# Patient Record
Sex: Female | Born: 1935 | Race: White | Hispanic: No | Smoking: Current every day smoker
Health system: Southern US, Community
[De-identification: ages and names within clinical notes are randomized; demographics above are authoritative.]

## PROBLEM LIST (undated history)

## (undated) DIAGNOSIS — I129 Hypertensive chronic kidney disease with stage 1 through stage 4 chronic kidney disease, or unspecified chronic kidney disease: Secondary | ICD-10-CM

## (undated) DIAGNOSIS — I10 Essential (primary) hypertension: Secondary | ICD-10-CM

## (undated) DIAGNOSIS — G4700 Insomnia, unspecified: Secondary | ICD-10-CM

## (undated) DIAGNOSIS — E559 Vitamin D deficiency, unspecified: Secondary | ICD-10-CM

## (undated) DIAGNOSIS — M1990 Unspecified osteoarthritis, unspecified site: Secondary | ICD-10-CM

## (undated) DIAGNOSIS — R0602 Shortness of breath: Secondary | ICD-10-CM

## (undated) DIAGNOSIS — E785 Hyperlipidemia, unspecified: Secondary | ICD-10-CM

## (undated) DIAGNOSIS — E46 Unspecified protein-calorie malnutrition: Secondary | ICD-10-CM

## (undated) DIAGNOSIS — F419 Anxiety disorder, unspecified: Secondary | ICD-10-CM

## (undated) DIAGNOSIS — F32A Depression, unspecified: Secondary | ICD-10-CM

## (undated) DIAGNOSIS — R7303 Prediabetes: Secondary | ICD-10-CM

## (undated) DIAGNOSIS — J449 Chronic obstructive pulmonary disease, unspecified: Secondary | ICD-10-CM

## (undated) DIAGNOSIS — C50919 Malignant neoplasm of unspecified site of unspecified female breast: Secondary | ICD-10-CM

## (undated) DIAGNOSIS — F329 Major depressive disorder, single episode, unspecified: Secondary | ICD-10-CM

## (undated) DIAGNOSIS — E119 Type 2 diabetes mellitus without complications: Secondary | ICD-10-CM

## (undated) DIAGNOSIS — K219 Gastro-esophageal reflux disease without esophagitis: Secondary | ICD-10-CM

## (undated) HISTORY — DX: Major depressive disorder, single episode, unspecified: F32.9

## (undated) HISTORY — DX: Anxiety disorder, unspecified: F41.9

## (undated) HISTORY — DX: Unspecified protein-calorie malnutrition: E46

## (undated) HISTORY — DX: Prediabetes: R73.03

## (undated) HISTORY — DX: Hyperlipidemia, unspecified: E78.5

## (undated) HISTORY — DX: Insomnia, unspecified: G47.00

## (undated) HISTORY — DX: Chronic obstructive pulmonary disease, unspecified: J44.9

## (undated) HISTORY — DX: Vitamin D deficiency, unspecified: E55.9

## (undated) HISTORY — PX: APPENDECTOMY: SHX54

## (undated) HISTORY — DX: Hypertensive chronic kidney disease with stage 1 through stage 4 chronic kidney disease, or unspecified chronic kidney disease: I12.9

## (undated) HISTORY — DX: Depression, unspecified: F32.A

---

## 1996-08-10 HISTORY — PX: BREAST SURGERY: SHX581

## 1997-12-05 ENCOUNTER — Other Ambulatory Visit: Admission: RE | Admit: 1997-12-05 | Discharge: 1997-12-05 | Payer: Self-pay | Admitting: Hematology and Oncology

## 1998-03-05 ENCOUNTER — Other Ambulatory Visit: Admission: RE | Admit: 1998-03-05 | Discharge: 1998-03-05 | Payer: Self-pay | Admitting: Hematology and Oncology

## 1998-03-07 ENCOUNTER — Encounter: Admission: RE | Admit: 1998-03-07 | Discharge: 1998-06-05 | Payer: Self-pay | Admitting: Hematology and Oncology

## 1998-12-22 ENCOUNTER — Emergency Department (HOSPITAL_COMMUNITY): Admission: EM | Admit: 1998-12-22 | Discharge: 1998-12-22 | Payer: Self-pay | Admitting: Emergency Medicine

## 1999-07-10 ENCOUNTER — Encounter: Payer: Self-pay | Admitting: Hematology and Oncology

## 1999-07-10 ENCOUNTER — Encounter: Admission: RE | Admit: 1999-07-10 | Discharge: 1999-07-10 | Payer: Self-pay | Admitting: Hematology and Oncology

## 1999-12-22 ENCOUNTER — Encounter: Admission: RE | Admit: 1999-12-22 | Discharge: 1999-12-22 | Payer: Self-pay | Admitting: Hematology and Oncology

## 1999-12-22 ENCOUNTER — Encounter: Payer: Self-pay | Admitting: Hematology and Oncology

## 2000-05-14 ENCOUNTER — Encounter: Payer: Self-pay | Admitting: Hematology and Oncology

## 2000-05-14 ENCOUNTER — Encounter: Admission: RE | Admit: 2000-05-14 | Discharge: 2000-05-14 | Payer: Self-pay | Admitting: Hematology and Oncology

## 2000-12-23 ENCOUNTER — Encounter: Admission: RE | Admit: 2000-12-23 | Discharge: 2000-12-23 | Payer: Self-pay | Admitting: Hematology and Oncology

## 2000-12-23 ENCOUNTER — Encounter: Payer: Self-pay | Admitting: Hematology and Oncology

## 2001-05-23 ENCOUNTER — Ambulatory Visit (HOSPITAL_COMMUNITY): Admission: RE | Admit: 2001-05-23 | Discharge: 2001-05-23 | Payer: Self-pay | Admitting: Hematology and Oncology

## 2001-05-23 ENCOUNTER — Encounter: Payer: Self-pay | Admitting: Hematology and Oncology

## 2001-12-22 ENCOUNTER — Encounter: Admission: RE | Admit: 2001-12-22 | Discharge: 2001-12-22 | Payer: Self-pay | Admitting: Hematology and Oncology

## 2001-12-22 ENCOUNTER — Encounter: Payer: Self-pay | Admitting: Hematology and Oncology

## 2005-12-09 ENCOUNTER — Encounter: Payer: Self-pay | Admitting: *Deleted

## 2007-11-28 ENCOUNTER — Emergency Department (HOSPITAL_COMMUNITY): Admission: EM | Admit: 2007-11-28 | Discharge: 2007-11-28 | Payer: Self-pay | Admitting: Emergency Medicine

## 2007-12-30 ENCOUNTER — Emergency Department (HOSPITAL_COMMUNITY): Admission: EM | Admit: 2007-12-30 | Discharge: 2007-12-30 | Payer: Self-pay | Admitting: Emergency Medicine

## 2008-01-27 ENCOUNTER — Emergency Department (HOSPITAL_COMMUNITY): Admission: EM | Admit: 2008-01-27 | Discharge: 2008-01-27 | Payer: Self-pay | Admitting: Family Medicine

## 2009-02-03 ENCOUNTER — Emergency Department (HOSPITAL_COMMUNITY): Admission: EM | Admit: 2009-02-03 | Discharge: 2009-02-03 | Payer: Self-pay | Admitting: Family Medicine

## 2010-04-11 ENCOUNTER — Emergency Department (HOSPITAL_COMMUNITY): Admission: EM | Admit: 2010-04-11 | Discharge: 2010-04-11 | Payer: Self-pay | Admitting: Family Medicine

## 2010-04-25 ENCOUNTER — Emergency Department (HOSPITAL_COMMUNITY): Admission: EM | Admit: 2010-04-25 | Discharge: 2010-04-25 | Payer: Self-pay | Admitting: Family Medicine

## 2010-08-14 ENCOUNTER — Emergency Department (HOSPITAL_COMMUNITY)
Admission: EM | Admit: 2010-08-14 | Discharge: 2010-08-14 | Payer: Self-pay | Source: Home / Self Care | Admitting: Emergency Medicine

## 2010-10-23 LAB — POCT URINALYSIS DIPSTICK
Glucose, UA: NEGATIVE mg/dL
Nitrite: NEGATIVE
Specific Gravity, Urine: 1.02 (ref 1.005–1.030)
Urobilinogen, UA: 0.2 mg/dL (ref 0.0–1.0)

## 2011-01-29 ENCOUNTER — Inpatient Hospital Stay (INDEPENDENT_AMBULATORY_CARE_PROVIDER_SITE_OTHER)
Admission: RE | Admit: 2011-01-29 | Discharge: 2011-01-29 | Disposition: A | Discharge status: Home / Self Care | Payer: Medicare Other | Source: Ambulatory Visit | Attending: Emergency Medicine | Admitting: Emergency Medicine

## 2011-01-29 ENCOUNTER — Ambulatory Visit (INDEPENDENT_AMBULATORY_CARE_PROVIDER_SITE_OTHER): Payer: Medicare Other

## 2011-01-29 DIAGNOSIS — R05 Cough: Secondary | ICD-10-CM

## 2011-01-29 DIAGNOSIS — J449 Chronic obstructive pulmonary disease, unspecified: Secondary | ICD-10-CM

## 2011-01-29 DIAGNOSIS — J3489 Other specified disorders of nose and nasal sinuses: Secondary | ICD-10-CM

## 2011-05-07 LAB — CBC
HCT: 35.9 — ABNORMAL LOW
Hemoglobin: 12.5
MCHC: 34.8
MCV: 94.1
RDW: 15.5

## 2011-05-07 LAB — POCT I-STAT, CHEM 8
BUN: 39 — ABNORMAL HIGH
Calcium, Ion: 1.03 — ABNORMAL LOW
Glucose, Bld: 162 — ABNORMAL HIGH
HCT: 37
TCO2: 27

## 2011-05-07 LAB — URINALYSIS, ROUTINE W REFLEX MICROSCOPIC
Glucose, UA: NEGATIVE
Nitrite: NEGATIVE
Protein, ur: NEGATIVE
Urobilinogen, UA: 0.2

## 2011-05-07 LAB — URINE MICROSCOPIC-ADD ON

## 2011-05-07 LAB — DIFFERENTIAL
Basophils Absolute: 0
Basophils Relative: 0
Eosinophils Relative: 0
Monocytes Absolute: 0.9
Monocytes Relative: 10

## 2011-12-29 ENCOUNTER — Ambulatory Visit (HOSPITAL_COMMUNITY)
Admission: RE | Admit: 2011-12-29 | Discharge: 2011-12-29 | Disposition: A | Discharge status: Home / Self Care | Payer: Medicare Other | Source: Ambulatory Visit | Attending: Emergency Medicine | Admitting: Emergency Medicine

## 2011-12-29 ENCOUNTER — Other Ambulatory Visit (HOSPITAL_COMMUNITY): Payer: Self-pay | Admitting: Emergency Medicine

## 2011-12-29 DIAGNOSIS — R05 Cough: Secondary | ICD-10-CM

## 2011-12-29 DIAGNOSIS — F17200 Nicotine dependence, unspecified, uncomplicated: Secondary | ICD-10-CM | POA: Insufficient documentation

## 2011-12-29 DIAGNOSIS — R059 Cough, unspecified: Secondary | ICD-10-CM | POA: Insufficient documentation

## 2012-03-21 DIAGNOSIS — I10 Essential (primary) hypertension: Secondary | ICD-10-CM

## 2013-01-19 ENCOUNTER — Emergency Department (INDEPENDENT_AMBULATORY_CARE_PROVIDER_SITE_OTHER)
Admission: EM | Admit: 2013-01-19 | Discharge: 2013-01-19 | Disposition: A | Discharge status: Home / Self Care | Payer: Medicare Other | Source: Home / Self Care

## 2013-01-19 ENCOUNTER — Encounter (HOSPITAL_COMMUNITY): Payer: Self-pay | Admitting: *Deleted

## 2013-01-19 DIAGNOSIS — M659 Synovitis and tenosynovitis, unspecified: Secondary | ICD-10-CM

## 2013-01-19 DIAGNOSIS — M65849 Other synovitis and tenosynovitis, unspecified hand: Secondary | ICD-10-CM

## 2013-01-19 MED ORDER — DICLOFENAC EPOLAMINE 1.3 % TD PTCH: MEDICATED_PATCH | TRANSDERMAL | Status: DC

## 2013-01-19 NOTE — ED Provider Notes (Signed)
History     CSN: [id number]  Arrival date & time 01/19/13  1004   First MD Initiated Contact with Patient 01/19/13 1033      Chief Complaint  Patient presents with  . Wrist Pain    (Consider location/radiation/quality/duration/timing/severity/associated sxs/prior treatment) HPI Comments: 79 year old female presents complaining of left ulnar wrist pain that goes through her hand and into her fifth digit. She has been working in her garden a lot and the longer she works, it makes her wrist start to swell and hurt. This has been going on for a few days. She denies any pain elsewhere or swelling elsewhere, but does admit to a history of osteoarthritis in her hands. She denies any history of MI or stroke. She denies any nausea, vomiting, diaphoresis, or dizziness.  Patient is a 79 y.o. female presenting with wrist pain.  Wrist Pain Pertinent negatives include no chest pain, no abdominal pain and no shortness of breath.    History reviewed. No pertinent past medical history.  History reviewed. No pertinent past surgical history.  No family history on file.  History  Substance Use Topics  . Smoking status: Current Every Day Smoker  . Smokeless tobacco: Not on file  . Alcohol Use: No    OB History   Grav Para Term Preterm Abortions TAB SAB Ect Mult Living                  Review of Systems  Constitutional: Negative for fever and chills.  Eyes: Negative for visual disturbance.  Respiratory: Negative for cough and shortness of breath.   Cardiovascular: Negative for chest pain, palpitations and leg swelling.  Gastrointestinal: Negative for nausea, vomiting and abdominal pain.  Endocrine: Negative for polydipsia and polyuria.  Genitourinary: Negative for dysuria, urgency and frequency.  Musculoskeletal: Positive for arthralgias (see HPI). Negative for myalgias.  Skin: Negative for rash.  Neurological: Negative for dizziness, weakness and light-headedness.    Allergies   Review of patient's allergies indicates no known allergies.  Home Medications   Current Outpatient Rx  Name  Route  Sig  Dispense  Refill  . diclofenac (FLECTOR) 1.3 % PTCH      Apply patch to pinky side of left wrist Q12 hrs   30 patch   0     BP 124/70  Pulse 78  Temp(Src) 97.6 F (36.4 C) (Oral)  SpO2 96%  Physical Exam  Nursing note and vitals reviewed. Constitutional: She is oriented to person, place, and time. Vital signs are normal. She appears well-developed and well-nourished. No distress.  HENT:  Head: Atraumatic.  Eyes: EOM are normal. Pupils are equal, round, and reactive to light.  Cardiovascular: Normal rate, regular rhythm and normal heart sounds.  Exam reveals no gallop and no friction rub.   No murmur heard. Pulmonary/Chest: Effort normal and breath sounds normal. No respiratory distress. She has no wheezes. She has no rales.  Abdominal: Soft. There is no tenderness.  Musculoskeletal:       Left wrist: She exhibits tenderness (tenderness in the distribution of the extensor carpui ulnaris) and swelling (mild). She exhibits normal range of motion, no bony tenderness, no effusion, no crepitus and no deformity.  Neurological: She is alert and oriented to person, place, and time. She has normal strength.  Skin: Skin is warm and dry. She is not diaphoretic.  Psychiatric: She has a normal mood and affect. Her behavior is normal. Judgment normal.    ED Course  Procedures (including critical care time)  Labs Reviewed - No data to display No results found.   1. Tenosynovitis of wrist       MDM  There are no other symptoms and non-musculoskeletal cause of this patient's wrist pain. Therefore, we'll treat this tenosynovitis with a flector patch twice a day and a wrist splint. She will followup in one week if she is not improved significantly   Meds ordered this encounter  Medications  . diclofenac (FLECTOR) 1.3 % PTCH    Sig: Apply patch to pinky side of  left wrist Q12 hrs    Dispense:  30 patch    Refill:  0           Graylon Good, PA-C 01/19/13 1108

## 2013-01-19 NOTE — ED Notes (Signed)
Pt  Reports   l   Wrist  Pain     For  About  1    Week          denys  Any  specefic  Injury          No  Obvious deformity  Noted

## 2013-05-10 ENCOUNTER — Emergency Department (INDEPENDENT_AMBULATORY_CARE_PROVIDER_SITE_OTHER): Payer: Medicare Other

## 2013-05-10 ENCOUNTER — Encounter (HOSPITAL_COMMUNITY): Payer: Self-pay | Admitting: Emergency Medicine

## 2013-05-10 ENCOUNTER — Emergency Department (INDEPENDENT_AMBULATORY_CARE_PROVIDER_SITE_OTHER)
Admission: EM | Admit: 2013-05-10 | Discharge: 2013-05-10 | Disposition: A | Discharge status: Home / Self Care | Payer: Medicare Other | Source: Home / Self Care

## 2013-05-10 DIAGNOSIS — S300XXA Contusion of lower back and pelvis, initial encounter: Secondary | ICD-10-CM

## 2013-05-10 HISTORY — DX: Essential (primary) hypertension: I10

## 2013-05-10 HISTORY — DX: Malignant neoplasm of unspecified site of unspecified female breast: C50.919

## 2013-05-10 MED ORDER — TRAMADOL-ACETAMINOPHEN 37.5-325 MG PO TABS: ORAL_TABLET | ORAL | Status: DC

## 2013-05-10 NOTE — ED Provider Notes (Signed)
Medical screening examination/treatment/procedure(s) were performed by non-physician practitioner and as supervising physician I was immediately available for consultation/collaboration.  Soua Lenk, M.D.  Donyale Falcon C Karyme Mcconathy, MD 05/10/13 1521 

## 2013-05-10 NOTE — ED Notes (Signed)
Pt reports she fell yest around 1230 outside her home and onto concrete Reports she fell backwards and since then has been having pain on left buttocks Pt ambulated well to exam room w/NAD and sister in law in room w/pt.  Denies: head inj/LOC She is alert w/no signs of acute distress.

## 2013-05-10 NOTE — ED Provider Notes (Signed)
CSN: [id number]     Arrival date & time 05/10/13  1126 History   First MD Initiated Contact with Patient 05/10/13 1301     Chief Complaint  Patient presents with  . Fall   (Consider location/radiation/quality/duration/timing/severity/associated sxs/prior Treatment) HPI Comments:  Well-preserved 79 -year-old female was standing yesterday and fell backwards onto her buttock. She is complaining of pain to the left buttock and posterior hip. She is able to stand and bear full weight and   Ambulate short steps but with a limp. She points to a bruise over the gluteus maximus. Denies injury to the head, neck, chest, back or other extremities.    Past Medical History  Diagnosis Date  . Hypertension   . Breast cancer    History reviewed. No pertinent past surgical history. No family history on file. History  Substance Use Topics  . Smoking status: Current Every Day Smoker    Types: Cigarettes  . Smokeless tobacco: Not on file  . Alcohol Use: No   OB History   Grav Para Term Preterm Abortions TAB SAB Ect Mult Living                 Review of Systems  Constitutional: Negative for fever, chills and activity change.  HENT: Negative.   Respiratory: Negative.   Cardiovascular: Negative.   Musculoskeletal:       As per HPI  Skin: Negative for color change, pallor and rash.       A half centimeter diameter ecchymosis over the mid left gluteus maximus.  Neurological: Negative.     Allergies  Review of patient's allergies indicates no known allergies.  Home Medications   Current Outpatient Rx  Name  Route  Sig  Dispense  Refill  . diclofenac (FLECTOR) 1.3 % PTCH      Apply patch to pinky side of left wrist Q12 hrs   30 patch   0    BP 160/88  Pulse 84  Temp(Src) 97.7 F (36.5 C) (Oral)  Resp 16  SpO2 99% Physical Exam  Nursing note and vitals reviewed. Constitutional: She is oriented to person, place, and time. She appears well-developed and well-nourished. No distress.   HENT:  Head: Normocephalic and atraumatic.  Eyes: EOM are normal. Pupils are equal, round, and reactive to light.  Neck: Normal range of motion. Neck supple.  Cardiovascular: Normal rate.   Pulmonary/Chest: Effort normal. No respiratory distress.  Musculoskeletal: She exhibits no edema.  Tenderness to the posterior hip and buttock. No tenderness to the ilium bilateral hip.  Neurological: She is alert and oriented to person, place, and time. No cranial nerve deficit.  Skin: Skin is warm and dry.  And ecchymosis to the left buttock as above.    ED Course  Procedures (including critical care time) Labs Review Labs Reviewed - No data to display Imaging Review Dg Hip Complete Left  05/10/2013   CLINICAL DATA:  Larey Seat yesterday onto concrete, left hip pain, bruising left buttock, initial encounter, history breast cancer, hypertension  EXAM: LEFT HIP - COMPLETE 2+ VIEW  COMPARISON:  None  FINDINGS: Osseous demineralization.  Hip and SI joint spaces symmetric and preserved.  Mild degenerative disc and facet disease changes at visualized lower lumbar spine with levoconvex scoliosis.  No acute fracture, dislocation, or bone destruction.  IMPRESSION: Osseous demineralization.  No acute left hip abnormalities.  Degenerative disc and facet disease changes lower lumbar spine.   Electronically Signed   By: Ulyses Southward M.D.   On:  05/10/2013 14:13    MDM   1. Contusion, buttock, initial encounter      Apply ice to the area of soreness of the nose as needed. May take Tylenol for pain. Any new symptoms problems or worsening may return or followup with your PCP. Reassurance. Patient requesting pain medicines. Ultracet, one half tablet every 6 hours when necessary pain #15  Hayden Rasmussen, NP 05/10/13 1426 and in the  Hayden Rasmussen, NP 05/10/13 1434

## 2013-06-16 ENCOUNTER — Encounter: Payer: Self-pay | Admitting: Internal Medicine

## 2013-06-16 DIAGNOSIS — F419 Anxiety disorder, unspecified: Secondary | ICD-10-CM | POA: Insufficient documentation

## 2013-06-16 DIAGNOSIS — E559 Vitamin D deficiency, unspecified: Secondary | ICD-10-CM

## 2013-06-16 DIAGNOSIS — G4700 Insomnia, unspecified: Secondary | ICD-10-CM | POA: Insufficient documentation

## 2013-06-16 DIAGNOSIS — E785 Hyperlipidemia, unspecified: Secondary | ICD-10-CM | POA: Insufficient documentation

## 2013-06-16 DIAGNOSIS — F329 Major depressive disorder, single episode, unspecified: Secondary | ICD-10-CM | POA: Insufficient documentation

## 2013-06-16 DIAGNOSIS — I129 Hypertensive chronic kidney disease with stage 1 through stage 4 chronic kidney disease, or unspecified chronic kidney disease: Secondary | ICD-10-CM

## 2013-06-16 DIAGNOSIS — R7303 Prediabetes: Secondary | ICD-10-CM

## 2013-06-16 DIAGNOSIS — I10 Essential (primary) hypertension: Secondary | ICD-10-CM | POA: Insufficient documentation

## 2013-06-16 DIAGNOSIS — J449 Chronic obstructive pulmonary disease, unspecified: Secondary | ICD-10-CM

## 2013-06-19 ENCOUNTER — Encounter: Payer: Self-pay | Admitting: Physician Assistant

## 2013-06-19 ENCOUNTER — Ambulatory Visit: Payer: Medicare Other | Admitting: Physician Assistant

## 2013-06-19 VITALS — BP 140/82 | HR 76 | Temp 98.6°F | Resp 16 | Ht 63.0 in | Wt 87.0 lb

## 2013-06-19 DIAGNOSIS — E785 Hyperlipidemia, unspecified: Secondary | ICD-10-CM

## 2013-06-19 DIAGNOSIS — I10 Essential (primary) hypertension: Secondary | ICD-10-CM

## 2013-06-19 DIAGNOSIS — E46 Unspecified protein-calorie malnutrition: Secondary | ICD-10-CM

## 2013-06-19 DIAGNOSIS — E559 Vitamin D deficiency, unspecified: Secondary | ICD-10-CM

## 2013-06-19 DIAGNOSIS — R7303 Prediabetes: Secondary | ICD-10-CM

## 2013-06-19 HISTORY — DX: Unspecified protein-calorie malnutrition: E46

## 2013-06-19 LAB — BASIC METABOLIC PANEL WITH GFR
BUN: 17 mg/dL (ref 6–23)
Calcium: 9.9 mg/dL (ref 8.4–10.5)
Chloride: 97 mEq/L (ref 96–112)
Creat: 0.83 mg/dL (ref 0.50–1.10)
GFR, Est African American: 77 mL/min
GFR, Est Non African American: 67 mL/min

## 2013-06-19 LAB — CBC WITH DIFFERENTIAL/PLATELET
Basophils Absolute: 0.1 10*3/uL (ref 0.0–0.1)
Basophils Relative: 1 % (ref 0–1)
Eosinophils Relative: 2 % (ref 0–5)
Lymphocytes Relative: 33 % (ref 12–46)
MCHC: 32.8 g/dL (ref 30.0–36.0)
MCV: 86.7 fL (ref 78.0–100.0)
Platelets: 289 10*3/uL (ref 150–400)
RDW: 15.3 % (ref 11.5–15.5)
WBC: 6.4 10*3/uL (ref 4.0–10.5)

## 2013-06-19 LAB — LIPID PANEL
Cholesterol: 188 mg/dL (ref 0–200)
HDL: 93 mg/dL (ref >39)
Total CHOL/HDL Ratio: 2 Ratio
Triglycerides: 77 mg/dL (ref <150)

## 2013-06-19 LAB — HEPATIC FUNCTION PANEL
Albumin: 4.5 g/dL (ref 3.5–5.2)
Alkaline Phosphatase: 73 U/L (ref 39–117)
Total Bilirubin: 0.6 mg/dL (ref 0.3–1.2)
Total Protein: 7.2 g/dL (ref 6.0–8.3)

## 2013-06-19 MED ORDER — LORAZEPAM 2 MG PO TABS: 2.0000 mg | ORAL_TABLET | Freq: Four times a day (QID) | ORAL | Status: DC | PRN

## 2013-06-19 MED ORDER — TRAMADOL-ACETAMINOPHEN 37.5-325 MG PO TABS: ORAL_TABLET | ORAL | Status: DC

## 2013-06-19 NOTE — Patient Instructions (Addendum)
The Lorazepam you can can take 1/2 at night or take 1/4 of the pill during the day for Anxiety.  You can take the tramadol as needed for pain.   Carpal Tunnel Syndrome The carpal tunnel is an area under the skin of the palm of your hand. Nerves, blood vessels, and strong tissues (tendons) pass through the tunnel. The tunnel can become puffy (swollen). If this happens, a nerve can be pinched in the wrist. This causes carpal tunnel syndrome.  HOME CARE  Take all medicine as told by your doctor.  If you were given a splint, wear it as told. Wear it at night or at times when your doctor told you to.  Rest your wrist from the activity that causes your pain.  Put ice on your wrist after long periods of wrist activity.  Put ice in a plastic bag.  Place a towel between your skin and the bag.  Leave the ice on for 15-20 minutes, 03-04 times a day.  Keep all doctor visits as told. GET HELP RIGHT AWAY IF:  You have new problems you cannot explain.  Your problems get worse and medicine does not help. MAKE SURE YOU:   Understand these instructions.  Will watch your condition.  Will get help right away if you are not doing well or get worse. Document Released: 07/16/2011 Document Revised: 10/19/2011 Document Reviewed: 07/16/2011 [facility] Patient Information 2014 [place], [place]. Generalized Anxiety Disorder Generalized anxiety disorder (GAD) is a mental disorder. It interferes with life functions, including relationships, work, and school. GAD is different from normal anxiety, which everyone experiences at some point in their lives in response to specific life events and activities. Normal anxiety actually helps [place] prepare for and get through these life events and activities. Normal anxiety goes away after the event or activity is over.  GAD causes anxiety that is not necessarily related to specific events or activities. It also causes excess anxiety in proportion to specific events or  activities. The anxiety associated with GAD is also difficult to control. GAD can vary from mild to severe. People with severe GAD can have intense waves of anxiety with physical symptoms (panic attacks).  SYMPTOMS The anxiety and worry associated with GAD are difficult to control. This anxiety and worry are related to many life events and activities and also occur more days than not for 6 months or longer. People with GAD also have three or more of the following symptoms (one or more in children):  Restlessness.   Fatigue.  Difficulty concentrating.   Irritability.  Muscle tension.  Difficulty sleeping or unsatisfying sleep. DIAGNOSIS GAD is diagnosed through an assessment by your caregiver. Your caregiver will ask you questions aboutyour mood,physical symptoms, and events in your life. Your caregiver may ask you about your medical history and use of alcohol or drugs, including prescription medications. Your caregiver may also do a physical exam and blood tests. Certain medical conditions and the use of certain substances can cause symptoms similar to those associated with GAD. Your caregiver may refer you to a mental health specialist for further evaluation. TREATMENT The following therapies are usually used to treat GAD:   Medication Antidepressant medication usually is prescribed for long-term daily control. Antianxiety medications may be added in severe cases, especially when panic attacks occur.   Talk therapy (psychotherapy) Certain types of talk therapy can be helpful in treating GAD by providing support, education, and guidance. A form of talk therapy called cognitive behavioral therapy can teach you  healthy ways to think about and react to daily life events and activities.  Stress managementtechniques These include yoga, meditation, and exercise and can be very helpful when they are practiced regularly. A mental health specialist can help determine which treatment is best for  you. Some people see improvement with one therapy. However, other people require a combination of therapies. Document Released: 11/21/2012 Document Reviewed: 11/21/2012 [facility] Patient Information 2014 [place], [place].

## 2013-06-19 NOTE — Progress Notes (Signed)
HPI Patient presents for 6 month follow up with hypertension, hyperlipidemia, prediabetes and vitamin D.  Patient's blood pressure has been controlled at home. Patient denies chest pain, shortness of breath, dizziness.  Patient's cholesterol is diet controlled and is on pravastatin  and denies myalgias diffuse. Does complain of right 1st MCP pain and is requesting more Tramadol.  she has  been working on diet and exercise for prediabetes, denies changes in vision, polys, and paresthesias.   Patient is on Vitamin D supplement.  She was given lorazepam 2 mg for sleeping at night and states that she is doing better with that but that her nerves during the day are bad.  Current Medications:    Medication List       This list is accurate as of: 06/19/13  9:14 AM.  Always use your most recent med list.               aspirin 325 MG EC tablet  Take 325 mg by mouth daily.     bisoprolol-hydrochlorothiazide 10-6.25 MG per tablet  Commonly known as:  ZIAC  Take 1 tablet by mouth daily.     citalopram 20 MG tablet  Commonly known as:  CELEXA  Take 20 mg by mouth daily.     diclofenac 1.3 % Ptch  Commonly known as:  FLECTOR  Apply patch to pinky side of left wrist Q12 hrs     hydrochlorothiazide 25 MG tablet  Commonly known as:  HYDRODIURIL  Take 25 mg by mouth daily.     LORazepam 2 MG tablet  Commonly known as:  ATIVAN  Take 1 tablet (2 mg total) by mouth every 6 (six) hours as needed for anxiety.     LOTENSIN 40 MG tablet  Generic drug:  benazepril  Take 40 mg by mouth daily.     minoxidil 2.5 MG tablet  Commonly known as:  LONITEN  Take 2.5 mg by mouth daily.     pravastatin 40 MG tablet  Commonly known as:  PRAVACHOL  Take 40 mg by mouth daily.     traMADol-acetaminophen 37.5-325 MG per tablet  Commonly known as:  ULTRACET  1/2 tablet every 6 hours as needed for pain       Allergies: No Known Allergies  ROS Constitutional: + insomnia Denies fever, chills, weight  loss/gain, headaches, fatigue, night sweats, and change in appetite. Eyes: Denies redness, blurred vision, diplopia, discharge, itchy, watery eyes.  ENT: Denies discharge, congestion, post nasal drip, sore throat, earache, dental pain, Tinnitus, Vertigo, Sinus pain, snoring.  Cardio: Denies chest pain, palpitations, irregular heartbeat,  dyspnea, diaphoresis, orthopnea, PND, claudication, edema Respiratory: denies cough, dyspnea,pleurisy, hoarseness, wheezing.  Gastrointestinal: Denies dysphagia, heartburn,  water brash, pain, cramps, nausea, vomiting, bloating, diarrhea, constipation, hematemesis, melena, hematochezia,  hemorrhoids Genitourinary: Denies dysuria, frequency, urgency, nocturia, hesitancy, discharge, hematuria, flank pain Musculoskeletal:+ right thumb pain X months Denies arthralgia, myalgia, stiffness, Jt. Swelling, pain, limp, and strain/sprain. Skin: Denies puritis, rash, hives, warts, acne, eczema, changing in skin lesion Neuro: Weakness, tremor, incoordination, spasms, paresthesia, pain Psychiatric: Denies confusion, memory loss, sensory loss Endocrine: Denies change in weight, skin, hair change, nocturia, and paresthesia, Diabetic Polys, visual blurring, hyper /hypo glycemic episodes.  Heme/Lymph: Excessive bleeding, bruising, enlarged lymph nodes Medical History:  Past Medical History  Diagnosis Date  . Hypertension   . Breast cancer   . Hyperlipidemia   . COPD (chronic obstructive pulmonary disease)   . Anxiety   . Depression   . Prediabetes   .  Vitamin D deficiency   . Insomnia   . Hypertensive CKD (chronic kidney disease)   . Unspecified protein-calorie malnutrition 06/19/2013   Family history- Review and unchanged Social history- Review and unchanged Physical Exam: Filed Vitals:   06/19/13 0841  BP: 140/82  Pulse: 76  Temp: 98.6 F (37 C)  Resp: 16  Estimated body mass index is 15.42 kg/(m^2) as calculated from the following:   Height as of this  encounter: 5\' 3"  (1.6 m).   Weight as of this encounter: 87 lb (39.463 kg).  General Appearance: Cachetic, barrel-chested female , in no apparent distress. Eyes: PERRLA, EOMs, conjunctiva no swelling or erythema, normal fundi and vessels. Sinuses: No Frontal/maxillary tenderness ENT/Mouth: Ext aud canals clear, with TMs without erythema, bulging.No erythema, swelling, or exudate on post pharynx.  Tonsils not swollen or erythematous. Hearing normal.  Neck: Supple, thyroid normal.  Respiratory: Respiratory effort normal, decreased BS bilaterally without rales, rhonci, wheezing or stridor.  Cardio: Heart sounds normal, regular rate and rhythm without murmurs, rubs or gallops. Without edema.  Abdomen: Flat, soft, with bowel sounds. Nontender, no guarding, rebound, hernias, masses, or organomegaly.  Lymphatics: Non tender without lymphadenopathy.  Musculoskeletal: Right MCP of thumb with atophy at thenar and pain. Full ROM all peripheral extremities, joint stability, 4/5 strength, and normal gait. Skin: Warm, dry without rashes, lesions, ecchymosis.  Neuro: Cranial nerves intact, reflexes equal bilaterally. Normal muscle tone, no cerebellar symptoms. Sensation intact.  Pysch: Awake and oriented X 3, normal affect, Insight and Judgment appropriate.   Assessement and Plan: Hypertension: Continue medication, monitor blood pressure at home. Continue DASH diet. Cholesterol: Continue diet and exercise. Check cholesterol.  Pre-diabetes-Continue diet and exercise. Check A1C Vitamin D Def- check level and continue medications.  Insomnia- cant continue Lorzepam 2mg - can take 1/2 at night and 1/4 during the day.  Right thumb pain- possible untreated CTS- wear brace at night for 3 weeks and refill tramadol for pain.  Malnutrition- BMI 15, add ensure.  Patient and I discussed about her living at home alone and how her children are concerned about her living from home. However she has not fallen in the past  year, cognitively she is going very well, we discussed her medications and the risk of falling and how to use those. She is able to do ADL's and feels confident at home. We will continue to monitor her current situation.   Quentin Mulling 9:14 AM

## 2013-06-20 ENCOUNTER — Telehealth: Payer: Self-pay

## 2013-06-20 LAB — INSULIN, FASTING: Insulin fasting, serum: 6 u[IU]/mL (ref 3–28)

## 2013-06-20 LAB — VITAMIN D 25 HYDROXY (VIT D DEFICIENCY, FRACTURES): Vit D, 25-Hydroxy: 42 ng/mL (ref 30–89)

## 2013-06-20 NOTE — Telephone Encounter (Signed)
Message copied by Linwood Dibbles on Tue Jun 20, 2013 11:21 AM ------      Message from: Quentin Mulling R      Created: Tue Jun 20, 2013  7:37 AM       Your BMP, CBC, chol, LFTs, TSH, insulin  are all normal. Please add on 2000 IU to your total Vitamin D. Your A1C is very very close to the Diabetes range of 6.5 or higher. You AIC is in prediabetic range which is between 5.7 and 6.4. This is a warning sign for diabetes. Your A1C is a measure of your sugar over the past 3 months and is not affected by what you have eaten over the past few days. Diabetes increases your chances of stroke and heart attack over 300 % and is the leading cause of blindness and kidney failure in the [place]. Please make sure you decrease bad carbs like white bread, white rice, potatoes, corn, soft drinks, pasta, cereals, refined sugars, sweet tea, dried fruits, and fruit juice. Good carbs are okay to eat in moderation like sweet potatoes, brown rice, whole grain pasta/bread, most fruit (expect dried fruit) and you can eat as many veggies as you want.        ------

## 2013-07-21 ENCOUNTER — Other Ambulatory Visit: Payer: Self-pay | Admitting: Internal Medicine

## 2013-07-21 DIAGNOSIS — R110 Nausea: Secondary | ICD-10-CM

## 2013-07-21 MED ORDER — PROCHLORPERAZINE MALEATE 5 MG PO TABS: ORAL_TABLET | ORAL | Status: DC

## 2013-07-21 MED ORDER — ONDANSETRON HCL 8 MG PO TABS: ORAL_TABLET | ORAL | Status: DC

## 2013-07-28 ENCOUNTER — Other Ambulatory Visit: Payer: Self-pay | Admitting: Internal Medicine

## 2013-07-30 ENCOUNTER — Encounter (HOSPITAL_COMMUNITY): Payer: Self-pay | Admitting: Emergency Medicine

## 2013-07-30 ENCOUNTER — Emergency Department (INDEPENDENT_AMBULATORY_CARE_PROVIDER_SITE_OTHER): Payer: Medicare Other

## 2013-07-30 ENCOUNTER — Emergency Department (INDEPENDENT_AMBULATORY_CARE_PROVIDER_SITE_OTHER)
Admission: EM | Admit: 2013-07-30 | Discharge: 2013-07-30 | Disposition: A | Discharge status: Home / Self Care | Payer: Medicare Other | Source: Home / Self Care | Attending: Family Medicine | Admitting: Family Medicine

## 2013-07-30 DIAGNOSIS — J438 Other emphysema: Secondary | ICD-10-CM

## 2013-07-30 DIAGNOSIS — J439 Emphysema, unspecified: Secondary | ICD-10-CM

## 2013-07-30 DIAGNOSIS — J441 Chronic obstructive pulmonary disease with (acute) exacerbation: Secondary | ICD-10-CM

## 2013-07-30 MED ORDER — ALBUTEROL SULFATE HFA 108 (90 BASE) MCG/ACT IN AERS: 2.0000 | INHALATION_SPRAY | RESPIRATORY_TRACT | Status: DC | PRN

## 2013-07-30 MED ORDER — AEROCHAMBER PLUS FLO-VU LARGE MISC: 1.0000 | Freq: Once | Status: AC

## 2013-07-30 MED ORDER — PREDNISONE 50 MG PO TABS: ORAL_TABLET | ORAL | Status: DC

## 2013-07-30 MED ORDER — LEVOFLOXACIN 500 MG PO TABS: 500.0000 mg | ORAL_TABLET | Freq: Every day | ORAL | Status: DC

## 2013-07-30 NOTE — ED Provider Notes (Signed)
CSN: [id number]     Arrival date & time 07/30/13  1243 History   First MD Initiated Contact with Patient 07/30/13 1418     Chief Complaint  Patient presents with  . Shortness of Breath   (Consider location/radiation/quality/duration/timing/severity/associated sxs/prior Treatment) HPI Comments: 79 year old female presents for evaluation of shortness of breath for one week. This is described as mild, feeling like it is difficult to take a full breath. She also has a productive cough for one week. Other than that, she has no concerns. Chills, NVD, chest pain, pleuritic pain. She has a history of COPD on the chart but she does not have any treatment for this. No weight loss or night sweats, no hemoptysis.  Her family member expresses concern for frequent nausea and early satiety. She has a history of breast cancer but apparently has not been checked for this approximately 10 years. She is a current smoker, currently smoking about one half pack daily, smoker for the past 50 years. No leg swelling. No recent air travel. No history of DVT or PE.  Patient is a 79 y.o. female presenting with shortness of breath.  Shortness of Breath Associated symptoms: cough   Associated symptoms: no abdominal pain, no chest pain, no fever, no rash and no vomiting     Past Medical History  Diagnosis Date  . Hypertension   . Breast cancer   . Hyperlipidemia   . COPD (chronic obstructive pulmonary disease)   . Anxiety   . Depression   . Prediabetes   . Vitamin D deficiency   . Insomnia   . Hypertensive CKD (chronic kidney disease)   . Unspecified protein-calorie malnutrition 06/19/2013   Past Surgical History  Procedure Laterality Date  . Breast surgery  1998   Family History  Problem Relation Age of Onset  . Heart disease Mother   . Heart disease Father   . Heart disease Daughter    History  Substance Use Topics  . Smoking status: Current Every Day Smoker -- 1.00 packs/day for 65 years    Types:  Cigarettes  . Smokeless tobacco: Never Used  . Alcohol Use: No   OB History   Grav Para Term Preterm Abortions TAB SAB Ect Mult Living                 Review of Systems  Constitutional: Negative for fever and chills.  Eyes: Negative for visual disturbance.  Respiratory: Positive for cough and shortness of breath.   Cardiovascular: Negative for chest pain, palpitations and leg swelling.  Gastrointestinal: Negative for nausea, vomiting and abdominal pain.  Endocrine: Negative for polydipsia and polyuria.  Genitourinary: Negative for dysuria, urgency and frequency.  Musculoskeletal: Negative for arthralgias and myalgias.  Skin: Negative for rash.  Neurological: Negative for dizziness, weakness and light-headedness.    Allergies  Review of patient's allergies indicates no known allergies.  Home Medications   Current Outpatient Rx  Name  Route  Sig  Dispense  Refill  . aspirin 325 MG EC tablet   Oral   Take 325 mg by mouth daily.         . benazepril (LOTENSIN) 40 MG tablet   Oral   Take 40 mg by mouth daily.         . bisoprolol-hydrochlorothiazide (ZIAC) 10-6.25 MG per tablet   Oral   Take 1 tablet by mouth daily.         . citalopram (CELEXA) 20 MG tablet   Oral   Take 20  mg by mouth daily.         . hydrochlorothiazide (HYDRODIURIL) 25 MG tablet   Oral   Take 25 mg by mouth daily.         Marland Kitchen LORazepam (ATIVAN) 2 MG tablet   Oral   Take 1 tablet (2 mg total) by mouth every 6 (six) hours as needed for anxiety.   90 tablet   0   . pravastatin (PRAVACHOL) 40 MG tablet   Oral   Take 40 mg by mouth daily.         Marland Kitchen albuterol (PROVENTIL HFA;VENTOLIN HFA) 108 (90 BASE) MCG/ACT inhaler   Inhalation   Inhale 2 puffs into the lungs every 4 (four) hours as needed for wheezing.   1 Inhaler   0   . diclofenac (FLECTOR) 1.3 % PTCH      Apply patch to pinky side of left wrist Q12 hrs   30 patch   0   . levofloxacin (LEVAQUIN) 500 MG tablet   Oral    Take 1 tablet (500 mg total) by mouth daily.   7 tablet   0   . minoxidil (LONITEN) 2.5 MG tablet   Oral   Take 2.5 mg by mouth daily.         . ondansetron (ZOFRAN) 8 MG tablet      1 tablet 3 x daily for nausea   30 tablet   0   . predniSONE (DELTASONE) 50 MG tablet      1 tab PO QD   5 tablet   0   . prochlorperazine (COMPAZINE) 5 MG tablet      1 to 2 tablets 3 - 4 x daily to prevent nausea   30 tablet   1   . Spacer/Aero-Holding Chambers (AEROCHAMBER PLUS FLO-VU LARGE) MISC   Other   1 each by Other route once.   1 each   0   . traMADol-acetaminophen (ULTRACET) 37.5-325 MG per tablet      1/2 tablet every 6 hours as needed for pain   90 tablet   0    BP 184/85  Pulse 81  Temp(Src) 98.4 F (36.9 C) (Oral)  Resp 16  SpO2 93% Physical Exam  Nursing note and vitals reviewed. Constitutional: She is oriented to person, place, and time. Vital signs are normal. She appears well-developed and well-nourished. No distress.  Thin habitus  HENT:  Head: Normocephalic and atraumatic.  Cardiovascular: Normal rate, regular rhythm, normal heart sounds and normal pulses.  Exam reveals no gallop and no friction rub.   No murmur heard. No peripheral edema  Pulmonary/Chest: Effort normal and breath sounds normal. No accessory muscle usage. Not tachypneic. No respiratory distress.  Neurological: She is alert and oriented to person, place, and time. She has normal strength. Coordination normal.  Skin: Skin is warm and dry. No rash noted. She is not diaphoretic.  Psychiatric: She has a normal mood and affect. Judgment normal.    ED Course  Procedures (including critical care time) Labs Review Labs Reviewed - No data to display Imaging Review Dg Chest 2 View  07/30/2013   CLINICAL DATA:  Shortness of breath.  EXAM: CHEST  2 VIEW  COMPARISON:  12/29/2011  FINDINGS: Severe COPD changes. Tortuosity of the thoracic aorta. Heart is normal size. No confluent opacities or  effusions. No acute bony abnormality.  IMPRESSION: Severe emphysema.  No acute findings.   Electronically Signed   By: Charlett Nose M.D.   On:  07/30/2013 14:47      MDM   1. Emphysema   2. COPD exacerbation    Copd exacerbation, mild to moderate.  Treat with albuterol, steroid, ABx.  F/U with PCP in 2 days    New Prescriptions   ALBUTEROL (PROVENTIL HFA;VENTOLIN HFA) 108 (90 BASE) MCG/ACT INHALER    Inhale 2 puffs into the lungs every 4 (four) hours as needed for wheezing.   LEVOFLOXACIN (LEVAQUIN) 500 MG TABLET    Take 1 tablet (500 mg total) by mouth daily.   PREDNISONE (DELTASONE) 50 MG TABLET    1 tab PO QD   SPACER/AERO-HOLDING CHAMBERS (AEROCHAMBER PLUS FLO-VU LARGE) MISC    1 each by Other route once.       Graylon Good, PA-C 07/30/13 [phone]

## 2013-07-30 NOTE — ED Notes (Signed)
79 yr old is here today with her sister-in-law with complaints of SOB, back pain - 3-4 dys, cough-grayish, chest congestion,  for 1 week. Hx of Bronchitis,  Denies: Chest pain

## 2013-07-31 NOTE — ED Provider Notes (Signed)
Medical screening examination/treatment/procedure(s) were performed by a resident physician or non-physician practitioner and as the supervising physician I was immediately available for consultation/collaboration.  Clementeen Graham, MD    Rodolph Bong, MD 07/31/13 [phone]

## 2013-08-07 ENCOUNTER — Encounter: Payer: Self-pay | Admitting: Emergency Medicine

## 2013-08-07 ENCOUNTER — Ambulatory Visit (INDEPENDENT_AMBULATORY_CARE_PROVIDER_SITE_OTHER): Payer: Medicare Other | Admitting: Emergency Medicine

## 2013-08-07 VITALS — BP 108/62 | HR 64 | Temp 98.2°F | Resp 16 | Wt 91.0 lb

## 2013-08-07 DIAGNOSIS — R05 Cough: Secondary | ICD-10-CM

## 2013-08-07 DIAGNOSIS — I959 Hypotension, unspecified: Secondary | ICD-10-CM

## 2013-08-07 DIAGNOSIS — J441 Chronic obstructive pulmonary disease with (acute) exacerbation: Secondary | ICD-10-CM

## 2013-08-07 MED ORDER — BENZONATATE 100 MG PO CAPS: 100.0000 mg | ORAL_CAPSULE | Freq: Three times a day (TID) | ORAL | Status: DC | PRN

## 2013-08-07 MED ORDER — DOXYCYCLINE HYCLATE 100 MG PO TABS: 100.0000 mg | ORAL_TABLET | Freq: Two times a day (BID) | ORAL | Status: DC

## 2013-08-07 MED ORDER — PREDNISONE 10 MG PO TABS: ORAL_TABLET | ORAL | Status: DC

## 2013-08-07 NOTE — Patient Instructions (Signed)
Pneumonia, Adult Pneumonia is an infection of the lungs. It may be caused by a germ (virus or bacteria). Some types of pneumonia can spread easily from person to person. This can happen when you cough or sneeze. HOME CARE  Only take medicine as told by your doctor.  Take your medicine (antibiotics) as told. Finish it even if you start to feel better.  Do not smoke.  You may use a vaporizer or humidifier in your room. This can help loosen thick spit (mucus).  Sleep so you are almost sitting up (semi-upright). This helps reduce coughing.  Rest. A shot (vaccine) can help prevent pneumonia. Shots are often advised for:  People over 73 years old.  Patients on chemotherapy.  People with long-term (chronic) lung problems.  People with immune system problems. GET HELP RIGHT AWAY IF:   You are getting worse.  You cannot control your cough, and you are losing sleep.  You cough up blood.  Your pain gets worse, even with medicine.  You have a fever.  Any of your problems are getting worse, not better.  You have shortness of breath or chest pain. MAKE SURE YOU:   Understand these instructions.  Will watch your condition.  Will get help right away if you are not doing well or get worse. Document Released: 01/13/2008 Document Revised: 10/19/2011 Document Reviewed: 10/17/2010 [facility] Patient Information 2014 [place], [place]. Chronic Obstructive Pulmonary Disease Chronic obstructive pulmonary disease (COPD) is a lung disease. The lungs become damaged. This makes it hard to get air in and out of your lungs. The damage to your lungs cannot be changed. There are things you can do to improve the lungs and make you feel better. HOME CARE  Take all medicines as told by your doctor.  Use medicines that you breathe in (inhale) as told by your doctor.  Avoid medicines or cough syrups that dry up your airway (antihistamines) and do not allow you to get rid of thick spit.  If you  smoke, stop.  Avoid being around smoke, chemicals, and fumes that bother your breathing.  Avoid people that have a catchy (contagious) sickness.  Avoid going outside when it is very hot, cold, or humid.  Use humidifiers in your home and near your bedside if it helps your breathing.  Drink enough fluids to keep your pee (urine) clear or pale yellow.  Eat healthy foods. Eat smaller meals more often and rest before meals.  Ask your doctor if it is okay to take vitamins or pills with minerals (supplements).  Exercise and stay active.  Rest with activity.  Get into a comfortable position when you have trouble breathing.  Learn and use tips on how to relax.  Learn and use tips on how to control your breathing as told by your doctor. Try:  Breathing in through your nose for 1 second. Then, breath out (exhale) through your puckered (like a whistle) lips for 2 seconds.  Putting one hand on your belly (abdomen). Breathe in slowly through your nose. Your hand on your belly should move out. Breathe out slowly through your puckered lips. Your hand on your belly should move in as you breathe out.  Learn and use controlled coughing to clear thick spit from your lungs. 1. Lean your head slightly forward. 2. Breathe in deeply. 3. Try to hold your breath for 3 seconds. 4. Keep your mouth slightly open while coughing 2 times. 5. Spit any thick spit out into a tissue. 6. Rest and do  the steps again 1 or 2 times as needed.  Get all shots (vaccines) a told by your doctor.  Learn how to manage stress.  Schedule and go to all follow-up doctor visits.  Go to therapy that can help you improve your lungs (pulmonary rehabilitation) as told by your doctor.  Use oxygen at home as told by your doctor. GET HELP RIGHT AWAY IF:   You can feel your heart beating really fast.  You have shortness of breath while resting.  You have shortness of breath that stops you from being able to talk.  You  have shortness of breath that stops you from doing normal activities.  You have chest pain lasting longer than 5 minutes.  You start to shake uncontrollably (seizure).  Your family or friends notice that you are flustered or confused.  You cough up more thick spit than usual.  There is a change in the color or thickness of the spit.  Breathing is more difficult than usual.  Your breathing is faster than usual.  Your skin color is more blueish than usual.  You are running out of the medicine you take for breathing.  You are anxious, uneasy, fearful, or restless.  You have a fever. MAKE SURE YOU:   Understand these instructions.  Will watch your condition.  Will get help right away if you are not doing well or get worse. Document Released: 01/13/2008 Document Revised: 07/13/2012 Document Reviewed: 03/23/2013 [facility] Patient Information 2014 [place], [place].

## 2013-08-08 NOTE — Progress Notes (Signed)
Subjective:    Patient ID: Stacy Byrd, female    DOB: 21-Apr-1931, 79 y.o.   MRN: [id number]  HPI Comments: 79 yo female currently she finished Levaquin and Pred from UC and still has sx. CXR at [facility] noted COPD only, per family. Family concerned she has been taking too many Lorazepam with 1/2 RX missing and 10 days will be left uncovered with lack of meds.  Patient notes she is feeling okay other than her breathing. She notes she is a little tired.  Family admits they cannot give good support for QD care but thinks they can mange RX and would like to clarify meds and needs. They have not ben taking her BP at home or making sure she is eating and drinking appropriately.   Shortness of Breath    Current Outpatient Prescriptions on File Prior to Visit  Medication Sig Dispense Refill  . albuterol (PROVENTIL HFA;VENTOLIN HFA) 108 (90 BASE) MCG/ACT inhaler Inhale 2 puffs into the lungs every 4 (four) hours as needed for wheezing.  1 Inhaler  0  . aspirin 325 MG EC tablet Take 325 mg by mouth daily.      . bisoprolol-hydrochlorothiazide (ZIAC) 10-6.25 MG per tablet Take 1 tablet by mouth daily.      . hydrochlorothiazide (HYDRODIURIL) 25 MG tablet Take 25 mg by mouth daily.      Marland Kitchen levofloxacin (LEVAQUIN) 500 MG tablet Take 1 tablet (500 mg total) by mouth daily.  7 tablet  0  . LORazepam (ATIVAN) 2 MG tablet TAKE 1/2 TO 1 TABLET AT BEDTIME AS NEEDED FOR SLEEP  30 tablet  0  . predniSONE (DELTASONE) 50 MG tablet 1 tab PO QD  5 tablet  0  . Spacer/Aero-Holding Chambers (AEROCHAMBER PLUS FLO-VU LARGE) MISC 1 each by Other route once.  1 each  0  . benazepril (LOTENSIN) 40 MG tablet Take 40 mg by mouth daily.      . citalopram (CELEXA) 20 MG tablet Take 20 mg by mouth daily.      . diclofenac (FLECTOR) 1.3 % PTCH Apply patch to pinky side of left wrist Q12 hrs  30 patch  0  . minoxidil (LONITEN) 2.5 MG tablet Take 2.5 mg by mouth daily.      . ondansetron (ZOFRAN) 8 MG tablet 1 tablet 3 x daily  for nausea  30 tablet  0  . pravastatin (PRAVACHOL) 40 MG tablet Take 40 mg by mouth daily.      . prochlorperazine (COMPAZINE) 5 MG tablet 1 to 2 tablets 3 - 4 x daily to prevent nausea  30 tablet  1  . traMADol-acetaminophen (ULTRACET) 37.5-325 MG per tablet 1/2 tablet every 6 hours as needed for pain  90 tablet  0   No current facility-administered medications on file prior to visit.   Review of patient's allergies indicates no known allergies.  Past Medical History  Diagnosis Date  . Hypertension   . Breast cancer   . Hyperlipidemia   . COPD (chronic obstructive pulmonary disease)   . Anxiety   . Depression   . Prediabetes   . Vitamin D deficiency   . Insomnia   . Hypertensive CKD (chronic kidney disease)   . Unspecified protein-calorie malnutrition 06/19/2013     Review of Systems  Constitutional: Positive for fatigue.  Respiratory: Positive for shortness of breath.    BP 108/62  Pulse 64  Temp(Src) 98.2 F (36.8 C) (Temporal)  Resp 16  Wt 91 lb (41.277 kg)  Objective:   Physical Exam  Nursing note and vitals reviewed. Constitutional: She appears well-developed. No distress.  THIN  HENT:  Head: Normocephalic and atraumatic.  Right Ear: External ear normal.  Left Ear: External ear normal.  Nose: Nose normal.  Mouth/Throat: Oropharynx is clear and moist.  Eyes: Conjunctivae and EOM are normal.  Neck: Normal range of motion. Neck supple. No JVD present. No thyromegaly present.  Cardiovascular: Normal rate, regular rhythm, normal heart sounds and intact distal pulses.   Pulmonary/Chest: Effort normal.  Rhonchi Bilateral  Abdominal: Soft. Bowel sounds are normal. She exhibits no distension and no mass. There is no tenderness. There is no rebound and no guarding.  Musculoskeletal: Normal range of motion. She exhibits no edema and no tenderness.  Lymphadenopathy:    She has no cervical adenopathy.  Neurological: She is alert. No cranial nerve deficit.  Skin:  Skin is warm and dry. No rash noted. No erythema. No pallor.  Psychiatric: She has a normal mood and affect. Her behavior is normal. Judgment and thought content normal.          Assessment & Plan:  1. ? Pneumonia vs COPD exacerbation- Restart nebs Q4 hours, Tessalon perles 100 mg TID,  Doxy 1000 mg BID Pred 10 mg DP all AD. Increase H2o call if sx increase or ER. 2. Hypotension/ Fatigue- check labs, increase activity and H2O. Decrease HCTZ and Ziac by 1/2, check BP call with results. Decrease lorazepam to 1/2 x 3 days then d/c. Meloxicam only if in pain. Instructions written and given to family.   Long OV with patients family/ care givers. They want to have PT re-establish living at home alone and assist with medication management only. Advised if patient starts declining they will need to look into nursing facility or home health.

## 2013-08-21 ENCOUNTER — Ambulatory Visit (INDEPENDENT_AMBULATORY_CARE_PROVIDER_SITE_OTHER): Payer: Medicare Other | Admitting: Emergency Medicine

## 2013-08-21 ENCOUNTER — Encounter: Payer: Self-pay | Admitting: Emergency Medicine

## 2013-08-21 VITALS — BP 180/84 | HR 58 | Temp 97.8°F | Resp 16 | Ht 62.5 in | Wt 84.0 lb

## 2013-08-21 DIAGNOSIS — I10 Essential (primary) hypertension: Secondary | ICD-10-CM

## 2013-08-21 DIAGNOSIS — R5383 Other fatigue: Secondary | ICD-10-CM

## 2013-08-21 DIAGNOSIS — Z0000 Encounter for general adult medical examination without abnormal findings: Secondary | ICD-10-CM

## 2013-08-21 DIAGNOSIS — R5381 Other malaise: Secondary | ICD-10-CM

## 2013-08-21 LAB — CBC WITH DIFFERENTIAL/PLATELET
BASOS PCT: 0 % (ref 0–1)
Basophils Absolute: 0 10*3/uL (ref 0.0–0.1)
EOS PCT: 2 % (ref 0–5)
Eosinophils Absolute: 0.2 10*3/uL (ref 0.0–0.7)
HEMATOCRIT: 41.5 % (ref 36.0–46.0)
HEMOGLOBIN: 13.6 g/dL (ref 12.0–15.0)
Lymphocytes Relative: 35 % (ref 12–46)
Lymphs Abs: 3.3 10*3/uL (ref 0.7–4.0)
MCH: 29.6 pg (ref 26.0–34.0)
MCHC: 32.8 g/dL (ref 30.0–36.0)
MCV: 90.4 fL (ref 78.0–100.0)
MONO ABS: 0.8 10*3/uL (ref 0.1–1.0)
MONOS PCT: 8 % (ref 3–12)
NEUTROS ABS: 5.3 10*3/uL (ref 1.7–7.7)
Neutrophils Relative %: 55 % (ref 43–77)
Platelets: 269 10*3/uL (ref 150–400)
RBC: 4.59 MIL/uL (ref 3.87–5.11)
RDW: 17 % — ABNORMAL HIGH (ref 11.5–15.5)
WBC: 9.6 10*3/uL (ref 4.0–10.5)

## 2013-08-21 LAB — HEPATIC FUNCTION PANEL
ALT: <8 U/L (ref 0–35)
AST: 14 U/L (ref 0–37)
Albumin: 4 g/dL (ref 3.5–5.2)
Alkaline Phosphatase: 54 U/L (ref 39–117)
BILIRUBIN INDIRECT: 0.5 mg/dL (ref 0.0–0.9)
BILIRUBIN TOTAL: 0.6 mg/dL (ref 0.3–1.2)
Bilirubin, Direct: 0.1 mg/dL (ref 0.0–0.3)
TOTAL PROTEIN: 6.5 g/dL (ref 6.0–8.3)

## 2013-08-21 LAB — BASIC METABOLIC PANEL WITH GFR
BUN: 33 mg/dL — ABNORMAL HIGH (ref 6–23)
CO2: 33 mEq/L — ABNORMAL HIGH (ref 19–32)
Calcium: 9.7 mg/dL (ref 8.4–10.5)
Chloride: 102 mEq/L (ref 96–112)
Creat: 0.87 mg/dL (ref 0.50–1.10)
GFR, EST NON AFRICAN AMERICAN: 63 mL/min
GFR, Est African American: 73 mL/min
GLUCOSE: 103 mg/dL — AB (ref 70–99)
POTASSIUM: 4 meq/L (ref 3.5–5.3)
Sodium: 144 mEq/L (ref 135–145)

## 2013-08-21 MED ORDER — LOSARTAN POTASSIUM 100 MG PO TABS: 100.0000 mg | ORAL_TABLET | Freq: Every day | ORAL | Status: DC

## 2013-08-21 NOTE — Patient Instructions (Signed)
Hypertension Hypertension is another name for high blood pressure. High blood pressure may mean that your heart needs to work harder to pump blood. Blood pressure consists of two numbers, which includes a higher number over a lower number (example: 110/72). HOME CARE   Make lifestyle changes as told by your doctor. This may include weight loss and exercise.  Take your blood pressure medicine every day.  Limit how much salt you use.  Stop smoking if you smoke.  Do not use drugs.  Talk to your doctor if you are using decongestants or birth control pills. These medicines might make blood pressure higher.  Females should not drink more than 1 alcoholic drink per day. Males should not drink more than 2 alcoholic drinks per day.  See your doctor as told. GET HELP RIGHT AWAY IF:   You have a blood pressure reading with a top number of 180 or higher.  You get a very bad headache.  You get blurred or changing vision.  You feel confused.  You feel weak, numb, or faint.  You get chest or belly (abdominal) pain.  You throw up (vomit).  You cannot breathe very well. MAKE SURE YOU:   Understand these instructions.  Will watch your condition.  Will get help right away if you are not doing well or get worse. Document Released: 01/13/2008 Document Revised: 10/19/2011 Document Reviewed: 01/13/2008 [employer] Patient Information 2014 [employer].  

## 2013-08-22 LAB — URINALYSIS, ROUTINE W REFLEX MICROSCOPIC
BILIRUBIN URINE: NEGATIVE
Glucose, UA: NEGATIVE mg/dL
Hgb urine dipstick: NEGATIVE
Ketones, ur: NEGATIVE mg/dL
Leukocytes, UA: NEGATIVE
NITRITE: NEGATIVE
Protein, ur: NEGATIVE mg/dL
SPECIFIC GRAVITY, URINE: 1.025 (ref 1.005–1.030)
Urobilinogen, UA: 0.2 mg/dL (ref 0.0–1.0)
pH: 6 (ref 5.0–8.0)

## 2013-08-22 LAB — MICROALBUMIN / CREATININE URINE RATIO
CREATININE, URINE: 147.1 mg/dL
MICROALB/CREAT RATIO: 24.1 mg/g (ref 0.0–30.0)
Microalb, Ur: 3.55 mg/dL — ABNORMAL HIGH (ref 0.00–1.89)

## 2013-08-23 MED ORDER — RANITIDINE HCL 150 MG PO TABS: 150.0000 mg | ORAL_TABLET | Freq: Every day | ORAL | Status: DC

## 2013-08-23 NOTE — Progress Notes (Signed)
Subjective:    Patient ID: Stacy Byrd, female    DOB: 07/29/1931, 80 y.o.   MRN: [id number]  HPI Comments: 80 yo female CPE with improved living situations with moving in with daughter. Son in law dispensing medicines and she seems to be more alert with his decreasing pain/ anxiety meds. She is eating better and has improved activity level. She is not drinking increased H2O AD and is still a little fatigued. They have not been checking her BP and she notes occasional headache. She notes increased stress with daughters new lung cancer diagnosis. LAST LABS BS 115 T 185 TG 95 H 89 L 77 A1C 6.0 MAG 1.8 INSULIN 7 D 43. SHE HAS NEVER HAD A CPE at our office before and does not have a comparison EKG. She denies CP. She has mild SOB with chronic tobacco use HX. She has smoked for over 60 years.   Hypertension Associated symptoms include anxiety and shortness of breath.  Anxiety Symptoms include shortness of breath.       Current Outpatient Prescriptions on File Prior to Visit  Medication Sig Dispense Refill  . albuterol (PROVENTIL HFA;VENTOLIN HFA) 108 (90 BASE) MCG/ACT inhaler Inhale 2 puffs into the lungs every 4 (four) hours as needed for wheezing.  1 Inhaler  0  . aspirin 325 MG EC tablet Take 325 mg by mouth daily.      . bisoprolol-hydrochlorothiazide (ZIAC) 10-6.25 MG per tablet Take 1 tablet by mouth daily.      . hydrochlorothiazide (HYDRODIURIL) 25 MG tablet Take 25 mg by mouth daily.      . meloxicam (MOBIC) 15 MG tablet Take 15 mg by mouth daily.      Marland Kitchen Spacer/Aero-Holding Chambers (AEROCHAMBER PLUS FLO-VU LARGE) MISC 1 each by Other route once.  1 each  0   No current facility-administered medications on file prior to visit.    Review of patient's allergies indicates no known allergies.  Past Medical History  Diagnosis Date  . Hypertension   . Breast cancer   . Hyperlipidemia   . COPD (chronic obstructive pulmonary disease)   . Anxiety   . Depression   . Prediabetes    . Vitamin D deficiency   . Insomnia   . Hypertensive CKD (chronic kidney disease)   . Unspecified protein-calorie malnutrition 06/19/2013   Past Surgical History  Procedure Laterality Date  . Breast surgery  1998   History  Substance Use Topics  . Smoking status: Current Every Day Smoker -- 0.50 packs/day for 65 years    Types: Cigarettes  . Smokeless tobacco: Never Used  . Alcohol Use: No   Family History  Problem Relation Age of Onset  . Heart disease Mother   . Heart disease Father   . Heart disease Daughter   . Cancer Daughter     lung  . Diabetes Daughter      Review of Systems  Constitutional: Positive for fatigue.  Respiratory: Positive for cough and shortness of breath.   All other systems reviewed and are negative.   BP 180/84  Pulse 58  Temp(Src) 97.8 F (36.6 C) (Temporal)  Resp 16  Ht 5' 2.5" (1.588 m)  Wt 84 lb (38.102 kg)  BMI 15.11 kg/m2     Objective:   Physical Exam  Nursing note and vitals reviewed. Constitutional: She is oriented to person, place, and time. She appears well-developed and well-nourished. No distress.  Thin  HENT:  Head: Normocephalic and atraumatic.  Right Ear: External  ear normal.  Left Ear: External ear normal.  Nose: Nose normal.  Mouth/Throat: Oropharynx is clear and moist.  Eyes: Conjunctivae and EOM are normal. Pupils are equal, round, and reactive to light. Right eye exhibits no discharge. Left eye exhibits no discharge. No scleral icterus.  Neck: Normal range of motion. Neck supple. No JVD present. No tracheal deviation present. No thyromegaly present.  Cardiovascular: Normal rate, regular rhythm, normal heart sounds and intact distal pulses.   Pulmonary/Chest: Effort normal and breath sounds normal.  Distant BS Bilateral  Abdominal: Soft. Bowel sounds are normal. She exhibits no distension and no mass. There is no tenderness. There is no rebound and no guarding.  Genitourinary:  Def due to age and pt preference   Musculoskeletal: Normal range of motion. She exhibits no edema and no tenderness.  Lymphadenopathy:    She has no cervical adenopathy.  Neurological: She is alert and oriented to person, place, and time. She has normal reflexes. No cranial nerve deficit. She exhibits normal muscle tone. Coordination normal.  Skin: Skin is warm and dry. No rash noted. No erythema. No pallor.  Psychiatric: She has a normal mood and affect. Her behavior is normal. Judgment and thought content normal.    EKG ? PR PROLONGATION, NO COMPARISON      Assessment & Plan:  1. CPE- Check labs 2. Fatigue- check labs, increase activity and H2O 3. HTN- Check BP call if >130/80, increase cardio Restart Losartan 100 QD. Call with results/ recheck EKG 2-4 weeks to compare, Can Refer to Cardio if desires for full workup.  Family notes at this time they need to deal with daughters cancer and trying to maintain patients state of current health. Advised needs increase H2O/ protein in diet and activity. W/C if SX increase

## 2013-08-29 ENCOUNTER — Other Ambulatory Visit: Payer: Self-pay | Admitting: Physician Assistant

## 2013-08-29 MED ORDER — LORAZEPAM 2 MG PO TABS: ORAL_TABLET | ORAL | Status: DC

## 2013-08-30 ENCOUNTER — Other Ambulatory Visit: Payer: Self-pay | Admitting: Emergency Medicine

## 2013-08-30 MED ORDER — ALBUTEROL SULFATE HFA 108 (90 BASE) MCG/ACT IN AERS: 2.0000 | INHALATION_SPRAY | RESPIRATORY_TRACT | Status: AC | PRN

## 2013-09-05 ENCOUNTER — Encounter: Payer: Self-pay | Admitting: Internal Medicine

## 2013-09-05 ENCOUNTER — Ambulatory Visit (INDEPENDENT_AMBULATORY_CARE_PROVIDER_SITE_OTHER): Payer: Medicare Other | Admitting: Internal Medicine

## 2013-09-05 DIAGNOSIS — Z79899 Other long term (current) drug therapy: Secondary | ICD-10-CM

## 2013-09-05 DIAGNOSIS — R7303 Prediabetes: Secondary | ICD-10-CM

## 2013-09-05 DIAGNOSIS — E785 Hyperlipidemia, unspecified: Secondary | ICD-10-CM

## 2013-09-05 DIAGNOSIS — I10 Essential (primary) hypertension: Secondary | ICD-10-CM

## 2013-09-05 DIAGNOSIS — R7309 Other abnormal glucose: Secondary | ICD-10-CM

## 2013-09-05 DIAGNOSIS — E559 Vitamin D deficiency, unspecified: Secondary | ICD-10-CM

## 2013-09-05 LAB — CBC WITH DIFFERENTIAL/PLATELET
BASOS ABS: 0 10*3/uL (ref 0.0–0.1)
Basophils Relative: 1 % (ref 0–1)
Eosinophils Absolute: 0.2 10*3/uL (ref 0.0–0.7)
Eosinophils Relative: 2 % (ref 0–5)
HCT: 40.7 % (ref 36.0–46.0)
Hemoglobin: 13 g/dL (ref 12.0–15.0)
LYMPHS ABS: 2.3 10*3/uL (ref 0.7–4.0)
LYMPHS PCT: 29 % (ref 12–46)
MCH: 29.1 pg (ref 26.0–34.0)
MCHC: 31.9 g/dL (ref 30.0–36.0)
MCV: 91.1 fL (ref 78.0–100.0)
Monocytes Absolute: 1.1 10*3/uL — ABNORMAL HIGH (ref 0.1–1.0)
Monocytes Relative: 14 % — ABNORMAL HIGH (ref 3–12)
NEUTROS PCT: 54 % (ref 43–77)
Neutro Abs: 4.2 10*3/uL (ref 1.7–7.7)
PLATELETS: 237 10*3/uL (ref 150–400)
RBC: 4.47 MIL/uL (ref 3.87–5.11)
RDW: 16.9 % — AB (ref 11.5–15.5)
WBC: 7.7 10*3/uL (ref 4.0–10.5)

## 2013-09-05 NOTE — Patient Instructions (Signed)

## 2013-09-05 NOTE — Progress Notes (Signed)
Patient ID: Stacy Byrd, female   DOB: 21-Sep-1930, 80 y.o.   MRN: [id number]   This very nice 80 y.o. female presents for 1 month follow up with Hypertension, Hyperlipidemia, COPD, Pre-Diabetes and Vitamin D Deficiency.   HTN predates many years. BP has been controlled at home. Today's 144/64. Patient denies any cardiac type chest pain, palpitations, dyspnea/orthopnea/PND, dizziness, claudication, or dependent edema.   Hyperlipidemia is controlled with diet & meds. Last Cholesterol was 185, Triglycerides were 95, HDL 89  and LDL 77-  at goal. Patient denies myalgias or other med SE's.    Also, the patient has history of PreDiabetes/insulin resistance with A1c 6.0% in May 2012 and again in Aug 2013. Patient denies any symptoms of reactive hypoglycemia, diabetic polys, paresthesias or visual blurring.   Further, Patient has history of Vitamin D Deficiency of 21 in Aug 2013 with last vitamin D of 43 in August 2014  . Patient supplements vitamin D without any suspected side-effects.    Medication List       This list is accurate as of: 09/05/13  8:36 PM.  Always use your most recent med list.               AEROCHAMBER PLUS FLO-VU LARGE Misc  1 each by Other route once.     albuterol 108 (90 BASE) MCG/ACT inhaler  Commonly known as:  PROVENTIL HFA;VENTOLIN HFA  Inhale 2 puffs into the lungs every 4 (four) hours as needed for wheezing.     aspirin 325 MG EC tablet  Take 325 mg by mouth daily.     bisoprolol-hydrochlorothiazide 10-6.25 MG per tablet  Commonly known as:  ZIAC  Take 1 tablet by mouth daily.     hydrochlorothiazide 25 MG tablet  Commonly known as:  HYDRODIURIL  Take 25 mg by mouth daily.     LORazepam 2 MG tablet  Commonly known as:  ATIVAN  1/2-1 pill as needed for sleep     losartan 100 MG tablet  Commonly known as:  COZAAR  Take 1 tablet (100 mg total) by mouth daily.     meloxicam 15 MG tablet  Commonly known as:  MOBIC  Take 15 mg by mouth daily.      ranitidine 150 MG tablet  Commonly known as:  ZANTAC  Take 1 tablet (150 mg total) by mouth at bedtime.         No Known Allergies  PMHx:   Past Medical History  Diagnosis Date  . Hypertension   . Breast cancer   . Hyperlipidemia   . COPD (chronic obstructive pulmonary disease)   . Anxiety   . Depression   . Prediabetes   . Vitamin D deficiency   . Insomnia   . Hypertensive CKD (chronic kidney disease)   . Unspecified protein-calorie malnutrition 06/19/2013    FHx:    Reviewed / unchanged  SHx:    Reviewed / unchanged  Systems Review: Constitutional: Denies fever, chills, wt changes, headaches, insomnia, fatigue, night sweats, change in appetite. Eyes: Denies redness, blurred vision, diplopia, discharge, itchy, watery eyes.  ENT: Denies discharge, congestion, post nasal drip, epistaxis, sore throat, earache, hearing loss, dental pain, tinnitus, vertigo, sinus pain, snoring.  CV: Denies chest pain, palpitations, irregular heartbeat, syncope, dyspnea, diaphoresis, orthopnea, PND, claudication, edema. Respiratory: denies cough, dyspnea, DOE, pleurisy, hoarseness, laryngitis, wheezing.  Gastrointestinal: Denies dysphagia, odynophagia, heartburn, reflux, water brash, abdominal pain or cramps, nausea, vomiting, bloating, diarrhea, constipation, hematemesis, melena, hematochezia,  or hemorrhoids. Genitourinary:  Denies dysuria, frequency, urgency, nocturia, hesitancy, discharge, hematuria, flank pain. Musculoskeletal: Denies arthralgias, myalgias, stiffness, jt. swelling, pain, limp, strain/sprain.  Skin: Denies pruritus, rash, hives, warts, acne, eczema, change in skin lesion(s). Neuro: No weakness, tremor, incoordination, spasms, paresthesia, or pain. Psychiatric: Denies confusion, memory loss, or sensory loss. Endo: Denies change in weight, skin, hair change.  Heme/Lymph: No excessive bleeding, bruising, orenlarged lymph nodes.  BP 144/64    P 68    R  18   T 97.9    WT 91.8  #  Estimated body mass index is 15.11 kg/(m^2)  Height      5' 2.5"   Weight    84 lb (July 2014) On Exam: Appears well nourished - in no distress. Eyes: PERRLA, EOMs, conjunctiva no swelling or erythema. Sinuses: No frontal/maxillary tenderness ENT/Mouth: EAC's clear, TM's nl w/o erythema, bulging. Nares clear w/o erythema, swelling, exudates. Oropharynx clear without erythema or exudates. Oral hygiene is good. Tongue normal, non obstructing. Hearing intact.  Neck: Supple. Thyroid nl. Car 2+/2+ without bruits, nodes or JVD. Chest: Respirations nl with BS clear & equal w/o rales, rhonchi, wheezing or stridor. O2 sat 92 % today at rest. Cor: Heart sounds normal w/ regular rate and rhythm without sig. murmurs, gallops, clicks, or rubs. Peripheral pulses normal and equal  without edema.  Abdomen: Soft & bowel sounds normal. Non-tender w/o guarding, rebound, hernias, masses, or organomegaly.  Lymphatics: Unremarkable.  Musculoskeletal: Full ROM all peripheral extremities, joint stability, 5/5 strength, and normal gait.  Skin: Warm, dry without exposed rashes, lesions, ecchymosis apparent.  Neuro: Cranial nerves intact, reflexes equal bilaterally. Sensory-motor testing grossly intact. Tendon reflexes grossly intact.  Pysch: Alert & oriented x 3. Insight and judgement nl & appropriate. No ideations.  Assessment and Plan:  1. Hypertension - Continue monitor blood pressure at home. Continue diet/meds same.  2. Hyperlipidemia - Continue diet/meds, exercise,& lifestyle modifications. Continue monitor periodic cholesterol/liver & renal functions   3. Pre-diabetes - Continue diet, exercise, lifestyle modifications. Monitor appropriate labs.  4. Vitamin D Deficiency - Continue supplementation.  5. COPD- discouraged smoking  Recommended regular exercise, BP monitoring, weight control, and discussed med and SE's. Recommended labs to assess and monitor clinical status. Further disposition pending  results of labs.

## 2013-09-06 LAB — BASIC METABOLIC PANEL WITH GFR
BUN: 26 mg/dL — ABNORMAL HIGH (ref 6–23)
CALCIUM: 9.4 mg/dL (ref 8.4–10.5)
CHLORIDE: 102 meq/L (ref 96–112)
CO2: 30 meq/L (ref 19–32)
Creat: 1.02 mg/dL (ref 0.50–1.10)
GFR, EST AFRICAN AMERICAN: 60 mL/min
GFR, Est Non African American: 52 mL/min — ABNORMAL LOW
Glucose, Bld: 95 mg/dL (ref 70–99)
POTASSIUM: 3.9 meq/L (ref 3.5–5.3)
Sodium: 140 mEq/L (ref 135–145)

## 2013-09-07 ENCOUNTER — Ambulatory Visit: Payer: Self-pay

## 2013-09-18 ENCOUNTER — Other Ambulatory Visit: Payer: Self-pay | Admitting: Emergency Medicine

## 2013-09-18 MED ORDER — BENZONATATE 100 MG PO CAPS: 100.0000 mg | ORAL_CAPSULE | Freq: Three times a day (TID) | ORAL | Status: DC | PRN

## 2013-09-22 ENCOUNTER — Emergency Department (HOSPITAL_COMMUNITY): Payer: Medicare Other

## 2013-09-22 ENCOUNTER — Emergency Department (HOSPITAL_COMMUNITY)
Admission: EM | Admit: 2013-09-22 | Discharge: 2013-09-22 | Disposition: A | Discharge status: Home / Self Care | Payer: Medicare Other | Attending: Emergency Medicine | Admitting: Emergency Medicine

## 2013-09-22 ENCOUNTER — Encounter (HOSPITAL_COMMUNITY): Payer: Self-pay | Admitting: Emergency Medicine

## 2013-09-22 DIAGNOSIS — I129 Hypertensive chronic kidney disease with stage 1 through stage 4 chronic kidney disease, or unspecified chronic kidney disease: Secondary | ICD-10-CM | POA: Insufficient documentation

## 2013-09-22 DIAGNOSIS — N189 Chronic kidney disease, unspecified: Secondary | ICD-10-CM | POA: Insufficient documentation

## 2013-09-22 DIAGNOSIS — S1093XA Contusion of unspecified part of neck, initial encounter: Secondary | ICD-10-CM

## 2013-09-22 DIAGNOSIS — Z7982 Long term (current) use of aspirin: Secondary | ICD-10-CM | POA: Insufficient documentation

## 2013-09-22 DIAGNOSIS — R197 Diarrhea, unspecified: Secondary | ICD-10-CM | POA: Insufficient documentation

## 2013-09-22 DIAGNOSIS — I498 Other specified cardiac arrhythmias: Secondary | ICD-10-CM | POA: Insufficient documentation

## 2013-09-22 DIAGNOSIS — Y929 Unspecified place or not applicable: Secondary | ICD-10-CM | POA: Insufficient documentation

## 2013-09-22 DIAGNOSIS — Z8639 Personal history of other endocrine, nutritional and metabolic disease: Secondary | ICD-10-CM | POA: Insufficient documentation

## 2013-09-22 DIAGNOSIS — S0083XA Contusion of other part of head, initial encounter: Secondary | ICD-10-CM

## 2013-09-22 DIAGNOSIS — S0990XA Unspecified injury of head, initial encounter: Secondary | ICD-10-CM | POA: Insufficient documentation

## 2013-09-22 DIAGNOSIS — R112 Nausea with vomiting, unspecified: Secondary | ICD-10-CM | POA: Insufficient documentation

## 2013-09-22 DIAGNOSIS — Z862 Personal history of diseases of the blood and blood-forming organs and certain disorders involving the immune mechanism: Secondary | ICD-10-CM | POA: Insufficient documentation

## 2013-09-22 DIAGNOSIS — J4489 Other specified chronic obstructive pulmonary disease: Secondary | ICD-10-CM | POA: Insufficient documentation

## 2013-09-22 DIAGNOSIS — J449 Chronic obstructive pulmonary disease, unspecified: Secondary | ICD-10-CM | POA: Insufficient documentation

## 2013-09-22 DIAGNOSIS — R1110 Vomiting, unspecified: Secondary | ICD-10-CM

## 2013-09-22 DIAGNOSIS — W010XXA Fall on same level from slipping, tripping and stumbling without subsequent striking against object, initial encounter: Secondary | ICD-10-CM | POA: Insufficient documentation

## 2013-09-22 DIAGNOSIS — Z791 Long term (current) use of non-steroidal anti-inflammatories (NSAID): Secondary | ICD-10-CM | POA: Insufficient documentation

## 2013-09-22 DIAGNOSIS — F17200 Nicotine dependence, unspecified, uncomplicated: Secondary | ICD-10-CM | POA: Insufficient documentation

## 2013-09-22 DIAGNOSIS — S0003XA Contusion of scalp, initial encounter: Secondary | ICD-10-CM | POA: Insufficient documentation

## 2013-09-22 DIAGNOSIS — Z79899 Other long term (current) drug therapy: Secondary | ICD-10-CM | POA: Insufficient documentation

## 2013-09-22 DIAGNOSIS — R109 Unspecified abdominal pain: Secondary | ICD-10-CM | POA: Insufficient documentation

## 2013-09-22 DIAGNOSIS — Z8659 Personal history of other mental and behavioral disorders: Secondary | ICD-10-CM | POA: Insufficient documentation

## 2013-09-22 DIAGNOSIS — Y939 Activity, unspecified: Secondary | ICD-10-CM | POA: Insufficient documentation

## 2013-09-22 DIAGNOSIS — Z853 Personal history of malignant neoplasm of breast: Secondary | ICD-10-CM | POA: Insufficient documentation

## 2013-09-22 LAB — CBC WITH DIFFERENTIAL/PLATELET
Basophils Absolute: 0 10*3/uL (ref 0.0–0.1)
Basophils Relative: 0 % (ref 0–1)
EOS ABS: 0 10*3/uL (ref 0.0–0.7)
Eosinophils Relative: 0 % (ref 0–5)
HCT: 37.9 % (ref 36.0–46.0)
HEMOGLOBIN: 12.7 g/dL (ref 12.0–15.0)
LYMPHS ABS: 1.7 10*3/uL (ref 0.7–4.0)
LYMPHS PCT: 19 % (ref 12–46)
MCH: 30.2 pg (ref 26.0–34.0)
MCHC: 33.5 g/dL (ref 30.0–36.0)
MCV: 90 fL (ref 78.0–100.0)
MONOS PCT: 6 % (ref 3–12)
Monocytes Absolute: 0.5 10*3/uL (ref 0.1–1.0)
NEUTROS ABS: 6.8 10*3/uL (ref 1.7–7.7)
NEUTROS PCT: 75 % (ref 43–77)
PLATELETS: 262 10*3/uL (ref 150–400)
RBC: 4.21 MIL/uL (ref 3.87–5.11)
RDW: 17.1 % — ABNORMAL HIGH (ref 11.5–15.5)
WBC: 9.1 10*3/uL (ref 4.0–10.5)

## 2013-09-22 LAB — URINALYSIS, ROUTINE W REFLEX MICROSCOPIC
Bilirubin Urine: NEGATIVE
Glucose, UA: NEGATIVE mg/dL
HGB URINE DIPSTICK: NEGATIVE
KETONES UR: 15 mg/dL — AB
Leukocytes, UA: NEGATIVE
Nitrite: NEGATIVE
Protein, ur: NEGATIVE mg/dL
SPECIFIC GRAVITY, URINE: 1.015 (ref 1.005–1.030)
Urobilinogen, UA: 0.2 mg/dL (ref 0.0–1.0)
pH: 7 (ref 5.0–8.0)

## 2013-09-22 LAB — COMPREHENSIVE METABOLIC PANEL
ALK PHOS: 64 U/L (ref 39–117)
ALT: 11 U/L (ref 0–35)
AST: 16 U/L (ref 0–37)
Albumin: 4.1 g/dL (ref 3.5–5.2)
BILIRUBIN TOTAL: 0.5 mg/dL (ref 0.3–1.2)
BUN: 25 mg/dL — ABNORMAL HIGH (ref 6–23)
CO2: 28 meq/L (ref 19–32)
Calcium: 9.2 mg/dL (ref 8.4–10.5)
Chloride: 96 mEq/L (ref 96–112)
Creatinine, Ser: 0.84 mg/dL (ref 0.50–1.10)
GFR calc non Af Amer: 63 mL/min — ABNORMAL LOW (ref >90)
GFR, EST AFRICAN AMERICAN: 74 mL/min — AB (ref >90)
GLUCOSE: 119 mg/dL — AB (ref 70–99)
POTASSIUM: 3.6 meq/L — AB (ref 3.7–5.3)
Sodium: 139 mEq/L (ref 137–147)
TOTAL PROTEIN: 7.2 g/dL (ref 6.0–8.3)

## 2013-09-22 LAB — LIPASE, BLOOD: Lipase: 19 U/L (ref 11–59)

## 2013-09-22 MED ORDER — ONDANSETRON HCL 4 MG/2ML IJ SOLN
4.0000 mg | Freq: Once | INTRAMUSCULAR | Status: AC
  Administered 2013-09-22: 4 mg via INTRAVENOUS
  Filled 2013-09-22: qty 2

## 2013-09-22 MED ORDER — ONDANSETRON 4 MG PO TBDP: ORAL_TABLET | ORAL | Status: DC

## 2013-09-22 NOTE — ED Notes (Addendum)
N/v/d x 8 hours with abd pain slipped on commode this am and hit her head on daughters knee has bruise to rt forehead over eye  Is on no blood thinners states was not dizzy when she fell and no loc after

## 2013-09-22 NOTE — ED Notes (Signed)
Pt denies wheelchair. Pt sts will follow up with PCP about low HR and high BP. Pt ambulatory, steady gait.

## 2013-09-22 NOTE — ED Notes (Signed)
Pt in gown, continuous bp cuff and pulse ox

## 2013-09-22 NOTE — ED Provider Notes (Signed)
CSN: [id number]     Arrival date & time 09/22/13  0803 History   First MD Initiated Contact with Patient 09/22/13 [phone]     Chief Complaint  Patient presents with  . Nausea     (Consider location/radiation/quality/duration/timing/severity/associated sxs/prior Treatment) HPI Comments: 80 yo female with htn, copd, ckd presents with non bloody vomiting, diarrhea multiple episodes since 8 hrs and abdo cramping then pt was on commode and slipped off and hit right side of head, no loc.  No neck pain.  Pt feels okay otherwise.  No chest pain or cardiac hx.  No blood thinners.  No fevers.  Felt normal yesterday.   The history is provided by the patient.    Past Medical History  Diagnosis Date  . Hypertension   . Breast cancer   . Hyperlipidemia   . COPD (chronic obstructive pulmonary disease)   . Anxiety   . Depression   . Prediabetes   . Vitamin D deficiency   . Insomnia   . Hypertensive CKD (chronic kidney disease)   . Unspecified protein-calorie malnutrition 06/19/2013   Past Surgical History  Procedure Laterality Date  . Breast surgery  1998   Family History  Problem Relation Age of Onset  . Heart disease Mother   . Heart disease Father   . Heart disease Daughter   . Cancer Daughter     lung  . Diabetes Daughter    History  Substance Use Topics  . Smoking status: Current Every Day Smoker -- 0.50 packs/day for 65 years    Types: Cigarettes  . Smokeless tobacco: Never Used  . Alcohol Use: No   OB History   Grav Para Term Preterm Abortions TAB SAB Ect Mult Living                 Review of Systems  Constitutional: Negative for fever and chills.  HENT: Negative for congestion.   Eyes: Negative for visual disturbance.  Respiratory: Negative for shortness of breath.   Cardiovascular: Negative for chest pain.  Gastrointestinal: Positive for nausea, vomiting, abdominal pain (mild cramping) and diarrhea.  Genitourinary: Negative for dysuria and flank pain.   Musculoskeletal: Negative for back pain, neck pain and neck stiffness.  Skin: Negative for rash.  Neurological: Negative for light-headedness and headaches.      Allergies  Review of patient's allergies indicates no known allergies.  Home Medications   Current Outpatient Rx  Name  Route  Sig  Dispense  Refill  . albuterol (PROVENTIL HFA;VENTOLIN HFA) 108 (90 BASE) MCG/ACT inhaler   Inhalation   Inhale 2 puffs into the lungs every 4 (four) hours as needed for wheezing.   1 Inhaler   6     Can you please also help patient with finding OTC  ...   . aspirin 325 MG EC tablet   Oral   Take 325 mg by mouth daily.         . bisoprolol-hydrochlorothiazide (ZIAC) 10-6.25 MG per tablet   Oral   Take 0.5 tablets by mouth daily.          Marland Kitchen losartan (COZAAR) 100 MG tablet   Oral   Take 1 tablet (100 mg total) by mouth daily.   30 tablet   11   . meloxicam (MOBIC) 15 MG tablet   Oral   Take 15 mg by mouth daily.         . ranitidine (ZANTAC) 150 MG tablet   Oral   Take 1 tablet (150  mg total) by mouth at bedtime.   30 tablet   1   . benzonatate (TESSALON PERLES) 100 MG capsule   Oral   Take 1 capsule (100 mg total) by mouth 3 (three) times daily as needed for cough.   42 capsule   1   . Spacer/Aero-Holding Chambers (AEROCHAMBER PLUS FLO-VU LARGE) MISC   Other   1 each by Other route once.   1 each   0    BP 178/68  Pulse 51  Temp(Src) 97.9 F (36.6 C) (Oral)  Resp 16  SpO2 94% Physical Exam  Nursing note and vitals reviewed. Constitutional: She is oriented to person, place, and time. She appears well-developed and well-nourished.  HENT:  Head: Normocephalic.  Mild dry mm Right frontal hematoma   Eyes: Conjunctivae are normal. Right eye exhibits no discharge. Left eye exhibits no discharge.  Neck: Normal range of motion. Neck supple. No tracheal deviation present.  Cardiovascular: Regular rhythm.  Bradycardia present.   Pulmonary/Chest: Effort  normal and breath sounds normal.  Abdominal: Soft. She exhibits no distension. There is no tenderness. There is no guarding.  Musculoskeletal: She exhibits tenderness (right frontal hematoma). She exhibits no edema.  Full rom of hips without pain Full rom of neck, no midline vertebral tenderness   Neurological: She is alert and oriented to person, place, and time.  5+ strength in UE and LE with f/e at major joints. Sensation to palpation intact in UE and LE. CNs 2-12 grossly intact.  EOMFI.  PERRL.   Finger nose and coordination intact bilateral.   Visual fields intact to finger testing.   Skin: Skin is warm. No rash noted.  Psychiatric: She has a normal mood and affect.    ED Course  Procedures (including critical care time) Labs Review Labs Reviewed  CBC WITH DIFFERENTIAL - Abnormal; Notable for the following:    RDW 17.1 (*)    All other components within normal limits  COMPREHENSIVE METABOLIC PANEL - Abnormal; Notable for the following:    Potassium 3.6 (*)    Glucose, Bld 119 (*)    BUN 25 (*)    GFR calc non Af Amer 63 (*)    GFR calc Af Amer 74 (*)    All other components within normal limits  URINALYSIS, ROUTINE W REFLEX MICROSCOPIC - Abnormal; Notable for the following:    Ketones, ur 15 (*)    All other components within normal limits  LIPASE, BLOOD   Imaging Review Dg Chest 2 View  09/22/2013   CLINICAL DATA:  Nausea  EXAM: CHEST  2 VIEW  COMPARISON:  DG CHEST 2 VIEW dated 07/30/2013; DG CHEST 2 VIEW dated 01/29/2011  FINDINGS: The lungs are hyperinflated likely secondary to COPD. There is no focal parenchymal opacity, pleural effusion, or pneumothorax. The heart and mediastinal contours are unremarkable.  The osseous structures are unremarkable.  IMPRESSION: No active cardiopulmonary disease.   Electronically Signed   By: Kathreen Devoid   On: 09/22/2013 10:51   Ct Head Wo Contrast  09/22/2013   CLINICAL DATA:  Status post fall.  EXAM: CT HEAD WITHOUT CONTRAST   TECHNIQUE: Contiguous axial images were obtained from the base of the skull through the vertex without intravenous contrast.  COMPARISON:  None.  FINDINGS: There is no evidence of mass effect, midline shift, or extra-axial fluid collections. There is no evidence of a space-occupying lesion or intracranial hemorrhage. There is no evidence of a cortical-based area of acute infarction. There is right  frontal lobe encephalomalacia. There is generalized cerebral atrophy. There is periventricular white matter low attenuation likely secondary to microangiopathy.  The ventricles and sulci are appropriate for the patient's age. The basal cisterns are patent.  Visualized portions of the orbits are unremarkable. The visualized portions of the paranasal sinuses and mastoid air cells are unremarkable. Cerebrovascular atherosclerotic calcifications are noted.  The osseous structures are unremarkable.  IMPRESSION: No acute intracranial pathology.   Electronically Signed   By: Kathreen Devoid   On: 09/22/2013 10:53    EKG Interpretation   None       MDM   Final diagnoses:  Vomiting  Diarrhea  Head injury, acute    Well appearing.  Abdo benign, non tender on exam, pt smiling and joking around. Plan for labs, fluids, CT head due to age/ ecchymosis and vomiting.  Close fup outpt discussed  Pt improved in ED, tolerated po fluids.  Recheck, comfortable, no pain.  Pt asking to go home, smiling.  Results and differential diagnosis were discussed with the patient. Close follow up outpatient was discussed, patient comfortable with the plan.       Mariea Clonts, MD 09/22/13 [phone]

## 2013-09-22 NOTE — Discharge Instructions (Signed)
If you were given medicines take as directed.  If you are on coumadin or contraceptives realize their levels and effectiveness is altered by many different medicines.  If you have any reaction (rash, tongues swelling, other) to the medicines stop taking and see a physician.   °Please follow up as directed and return to the ER or see a physician for new or worsening symptoms.  Thank you. ° ° °

## 2013-10-02 ENCOUNTER — Ambulatory Visit: Payer: Self-pay | Admitting: Internal Medicine

## 2013-10-10 ENCOUNTER — Other Ambulatory Visit: Payer: Self-pay | Admitting: Emergency Medicine

## 2013-10-10 MED ORDER — HYOSCYAMINE SULFATE 0.125 MG PO TABS: 0.1250 mg | ORAL_TABLET | ORAL | Status: DC | PRN

## 2013-10-10 MED ORDER — ONDANSETRON 4 MG PO TBDP: ORAL_TABLET | ORAL | Status: DC

## 2013-10-11 ENCOUNTER — Other Ambulatory Visit: Payer: Self-pay | Admitting: Emergency Medicine

## 2013-10-26 ENCOUNTER — Other Ambulatory Visit: Payer: Self-pay | Admitting: Emergency Medicine

## 2013-11-07 ENCOUNTER — Ambulatory Visit (INDEPENDENT_AMBULATORY_CARE_PROVIDER_SITE_OTHER): Payer: Medicare Other | Admitting: Physician Assistant

## 2013-11-07 ENCOUNTER — Encounter: Payer: Self-pay | Admitting: Physician Assistant

## 2013-11-07 VITALS — BP 122/78 | HR 60 | Temp 98.6°F | Resp 16 | Wt 93.0 lb

## 2013-11-07 DIAGNOSIS — R7303 Prediabetes: Secondary | ICD-10-CM

## 2013-11-07 DIAGNOSIS — I10 Essential (primary) hypertension: Secondary | ICD-10-CM

## 2013-11-07 DIAGNOSIS — E559 Vitamin D deficiency, unspecified: Secondary | ICD-10-CM

## 2013-11-07 DIAGNOSIS — G4700 Insomnia, unspecified: Secondary | ICD-10-CM

## 2013-11-07 DIAGNOSIS — E785 Hyperlipidemia, unspecified: Secondary | ICD-10-CM

## 2013-11-07 DIAGNOSIS — R7309 Other abnormal glucose: Secondary | ICD-10-CM

## 2013-11-07 DIAGNOSIS — J449 Chronic obstructive pulmonary disease, unspecified: Secondary | ICD-10-CM

## 2013-11-07 DIAGNOSIS — E46 Unspecified protein-calorie malnutrition: Secondary | ICD-10-CM

## 2013-11-07 DIAGNOSIS — I129 Hypertensive chronic kidney disease with stage 1 through stage 4 chronic kidney disease, or unspecified chronic kidney disease: Secondary | ICD-10-CM

## 2013-11-07 DIAGNOSIS — F419 Anxiety disorder, unspecified: Secondary | ICD-10-CM

## 2013-11-07 LAB — CBC WITH DIFFERENTIAL/PLATELET
Basophils Absolute: 0.1 10*3/uL (ref 0.0–0.1)
Basophils Relative: 1 % (ref 0–1)
EOS ABS: 0.3 10*3/uL (ref 0.0–0.7)
Eosinophils Relative: 4 % (ref 0–5)
HCT: 36.7 % (ref 36.0–46.0)
HEMOGLOBIN: 12 g/dL (ref 12.0–15.0)
LYMPHS ABS: 2.4 10*3/uL (ref 0.7–4.0)
Lymphocytes Relative: 34 % (ref 12–46)
MCH: 28.8 pg (ref 26.0–34.0)
MCHC: 32.7 g/dL (ref 30.0–36.0)
MCV: 88 fL (ref 78.0–100.0)
MONOS PCT: 8 % (ref 3–12)
Monocytes Absolute: 0.6 10*3/uL (ref 0.1–1.0)
Neutro Abs: 3.7 10*3/uL (ref 1.7–7.7)
Neutrophils Relative %: 53 % (ref 43–77)
Platelets: 231 10*3/uL (ref 150–400)
RBC: 4.17 MIL/uL (ref 3.87–5.11)
RDW: 16.3 % — ABNORMAL HIGH (ref 11.5–15.5)
WBC: 7 10*3/uL (ref 4.0–10.5)

## 2013-11-07 LAB — HEMOGLOBIN A1C
HEMOGLOBIN A1C: 5.9 % — AB (ref <5.7)
Mean Plasma Glucose: 123 mg/dL — ABNORMAL HIGH (ref <117)

## 2013-11-07 MED ORDER — ALPRAZOLAM 0.5 MG PO TABS: 0.5000 mg | ORAL_TABLET | Freq: Three times a day (TID) | ORAL | Status: DC | PRN

## 2013-11-07 NOTE — Patient Instructions (Signed)
 Bad carbs also include fruit juice, alcohol, and sweet tea. These are empty calories that do not signal to your brain that you are full.   Please remember the good carbs are still carbs which convert into sugar. So please measure them out no more than 1/2-1 cup of rice, oatmeal, pasta, and beans.  Veggies are however free foods! Pile them on.   I like lean protein at every meal such as chicken, turkey, pork chops, cottage cheese, etc. Just do not fry these meats and please center your meal around vegetable, the meats should be a side dish.   No all fruit is created equal. Please see the list below, the fruit at the bottom is higher in sugars than the fruit at the top   Insomnia Insomnia is frequent trouble falling and/or staying asleep. Insomnia can be a long term problem or a short term problem. Both are common. Insomnia can be a short term problem when the wakefulness is related to a certain stress or worry. Long term insomnia is often related to ongoing stress during waking hours and/or poor sleeping habits. Overtime, sleep deprivation itself can make the problem worse. Every little thing feels more severe because you are overtired and your ability to cope is decreased. CAUSES   Stress, anxiety, and depression.  Poor sleeping habits.  Distractions such as TV in the bedroom.  Naps close to bedtime.  Engaging in emotionally charged conversations before bed.  Technical reading before sleep.  Alcohol and other sedatives. They may make the problem worse. They can hurt normal sleep patterns and normal dream activity.  Stimulants such as caffeine for several hours prior to bedtime.  Pain syndromes and shortness of breath can cause insomnia.  Exercise late at night.  Changing time zones may cause sleeping problems (jet lag). It is sometimes helpful to have someone observe your sleeping patterns. They should look for periods of not breathing during the night (sleep apnea). They should  also look to see how long those periods last. If you live alone or observers are uncertain, you can also be observed at a sleep clinic where your sleep patterns will be professionally monitored. Sleep apnea requires a checkup and treatment. Give your caregivers your medical history. Give your caregivers observations your family has made about your sleep.  SYMPTOMS   Not feeling rested in the morning.  Anxiety and restlessness at bedtime.  Difficulty falling and staying asleep. TREATMENT   Your caregiver may prescribe treatment for an underlying medical disorders. Your caregiver can give advice or help if you are using alcohol or other drugs for self-medication. Treatment of underlying problems will usually eliminate insomnia problems.  Medications can be prescribed for short time use. They are generally not recommended for lengthy use.  Over-the-counter sleep medicines are not recommended for lengthy use. They can be habit forming.  You can promote easier sleeping by making lifestyle changes such as:  Using relaxation techniques that help with breathing and reduce muscle tension.  Exercising earlier in the day.  Changing your diet and the time of your last meal. No night time snacks.  Establish a regular time to go to bed.  Counseling can help with stressful problems and worry.  Soothing music and white noise may be helpful if there are background noises you cannot remove.  Stop tedious detailed work at least one hour before bedtime. HOME CARE INSTRUCTIONS   Keep a diary. Inform your caregiver about your progress. This includes any medication side effects.   See your caregiver regularly. Take note of:  Times when you are asleep.  Times when you are awake during the night.  The quality of your sleep.  How you feel the next day. This information will help your caregiver care for you.  Get out of bed if you are still awake after 15 minutes. Read or do some quiet activity. Keep  the lights down. Wait until you feel sleepy and go back to bed.  Keep regular sleeping and waking hours. Avoid naps.  Exercise regularly.  Avoid distractions at bedtime. Distractions include watching television or engaging in any intense or detailed activity like attempting to balance the household checkbook.  Develop a bedtime ritual. Keep a familiar routine of bathing, brushing your teeth, climbing into bed at the same time each night, listening to soothing music. Routines increase the success of falling to sleep faster.  Use relaxation techniques. This can be using breathing and muscle tension release routines. It can also include visualizing peaceful scenes. You can also help control troubling or intruding thoughts by keeping your mind occupied with boring or repetitive thoughts like the old concept of counting sheep. You can make it more creative like imagining planting one beautiful flower after another in your backyard garden.  During your day, work to eliminate stress. When this is not possible use some of the previous suggestions to help reduce the anxiety that accompanies stressful situations. MAKE SURE YOU:   Understand these instructions.  Will watch your condition.  Will get help right away if you are not doing well or get worse. Document Released: 07/24/2000 Document Revised: 10/19/2011 Document Reviewed: 08/24/2007 [employer] Patient Information 2014 [employer].  

## 2013-11-07 NOTE — Progress Notes (Signed)
HPI 80 y.o. female  presents for 3 month follow up with hypertension, hyperlipidemia, prediabetes and vitamin D. Her blood pressure has been controlled at home, today their BP is BP: 122/78 mmHg She does not workout. She denies chest pain, shortness of breath, dizziness.  She is not on cholesterol medication and denies myalgias. Her cholesterol is at goal. The cholesterol last visit was:   Lab Results  Component Value Date   CHOL 188 06/19/2013   HDL 93 06/19/2013   LDLCALC 80 06/19/2013   TRIG 77 06/19/2013   CHOLHDL 2.0 06/19/2013   She has been working on diet and exercise for prediabetes, and denies polydipsia, polyuria and visual disturbances. Last A1C in the office was:  Lab Results  Component Value Date   HGBA1C 6.2* 06/19/2013   Patient is on Vitamin D supplement.   She states that she is under increased stress because she is taking care of her daughter who has lung cancer, due to this she has had decreased sleep.  She continues to smoke 1 cig occ but has decreased.    Current Medications:  Current Outpatient Prescriptions on File Prior to Visit  Medication Sig Dispense Refill  . albuterol (PROVENTIL HFA;VENTOLIN HFA) 108 (90 BASE) MCG/ACT inhaler Inhale 2 puffs into the lungs every 4 (four) hours as needed for wheezing.  1 Inhaler  6  . aspirin 325 MG EC tablet Take 325 mg by mouth daily.      . benzonatate (TESSALON) 100 MG capsule TAKE 1 CAPSULE (100 MG TOTAL) BY MOUTH 3 (THREE) TIMES DAILY AS NEEDED FOR COUGH.  42 capsule  1  . bisoprolol-hydrochlorothiazide (ZIAC) 10-6.25 MG per tablet Take 0.5 tablets by mouth daily.       . hyoscyamine (LEVSIN, ANASPAZ) 0.125 MG tablet Take 1 tablet (0.125 mg total) by mouth every 4 (four) hours as needed.  60 tablet  0  . losartan (COZAAR) 100 MG tablet Take 1 tablet (100 mg total) by mouth daily.  30 tablet  11  . meloxicam (MOBIC) 15 MG tablet Take 15 mg by mouth daily.      . ondansetron (ZOFRAN ODT) 4 MG disintegrating tablet 4mg   ODT q4 hours prn nausea/vomit  30 tablet  0  . ranitidine (ZANTAC) 150 MG tablet TAKE 1 TABLET (150 MG TOTAL) BY MOUTH AT BEDTIME.  30 tablet  2  . Spacer/Aero-Holding Chambers (AEROCHAMBER PLUS FLO-VU LARGE) MISC 1 each by Other route once.  1 each  0   No current facility-administered medications on file prior to visit.   Medical History:  Past Medical History  Diagnosis Date  . Hypertension   . Breast cancer   . Hyperlipidemia   . COPD (chronic obstructive pulmonary disease)   . Anxiety   . Depression   . Prediabetes   . Vitamin D deficiency   . Insomnia   . Hypertensive CKD (chronic kidney disease)   . Unspecified protein-calorie malnutrition 06/19/2013   Allergies: No Known Allergies   Review of Systems: [X]  = complains of  [ ]  = denies  General: Fatigue [ ]  Fever [ ]  Chills [ ]  Weakness [ ]   Insomnia [ ]  Eyes: Redness [ ]  Blurred vision [ ]  Diplopia [ ]   ENT: Congestion [ ]  Sinus Pain [ ]  Post Nasal Drip [ ]  Sore Throat [ ]  Earache [ ]   Cardiac: Chest pain/pressure [ ]  SOB [ ]  Orthopnea [ ]   Palpitations [ ]   Paroxysmal nocturnal dyspnea[ ]  Claudication [ ]  Edema [ ]   Pulmonary: Cough [ ]  Wheezing[ ]   SOB [ ]   Snoring [ ]   GI: Nausea [ ]  Vomiting[ ]  Dysphagia[ ]  Heartburn[ ]  Abdominal pain [ ]  Constipation [ ] ; Diarrhea [ ] ; BRBPR [ ]  Melena[ ]  GU: Hematuria[ ]  Dysuria [ ]  Nocturia[ ]  Urgency [ ]   Hesitancy [ ]  Discharge [ ]  Neuro: Headaches[ ]  Vertigo[ ]  Paresthesias[ ]  Spasm [ ]  Speech changes [ ]  Incoordination [ ]   Ortho: Arthritis [ ]  Joint pain [ ]  Muscle pain [ ]  Joint swelling [ ]  Back Pain [ ]  Skin:  Rash [ ]   Pruritis [ ]  Change in skin lesion [ ]   Psych: Depression[ ]  Anxiety[X ] Confusion [ ]  Memory loss [ ]   Heme/Lypmh: Bleeding [ ]  Bruising [ ]  Enlarged lymph nodes [ ]   Endocrine: Visual blurring [ ]  Paresthesia [ ]  Polyuria [ ]  Polydypsea [ ]    Heat/cold intolerance [ ]  Hypoglycemia [ ]   Family history- Review and unchanged Social history- Review and  unchanged Physical Exam: BP 122/78  Pulse 60  Temp(Src) 98.6 F (37 C)  Resp 16  Wt 93 lb (42.185 kg) Wt Readings from Last 3 Encounters:  11/07/13 93 lb (42.185 kg)  08/21/13 84 lb (38.102 kg)  08/07/13 91 lb (41.277 kg)   General Appearance: cachetic, in no apparent distress. Eyes: PERRLA, EOMs, conjunctiva no swelling or erythema Sinuses: No Frontal/maxillary tenderness ENT/Mouth: Ext aud canals clear, TMs without erythema, bulging. No erythema, swelling, or exudate on post pharynx.  Tonsils not swollen or erythematous. Hearing normal.  Neck: Supple, thyroid normal.  Respiratory: Respiratory effort normal, BS decreased bilaterally without rales, rhonchi, wheezing or stridor.  Cardio: RRR with no MRGs. Brisk peripheral pulses without edema.  Abdomen: Soft, + BS.  Non tender, no guarding, rebound, hernias, masses. Lymphatics: Non tender without lymphadenopathy.  Musculoskeletal: Full ROM, 5/5 strength, normal gait.  Skin: Warm, dry without rashes, lesions, ecchymosis.  Neuro: Cranial nerves intact. Normal muscle tone, no cerebellar symptoms. Sensation intact.  Psych: Awake and oriented X 3, normal affect, Insight and Judgment appropriate.   Assessment and Plan:  Hypertension: Continue medication, monitor blood pressure at home. Continue DASH diet. Cholesterol: Continue diet and exercise. Check cholesterol.  Pre-diabetes-Continue diet and exercise. Check A1C Malnutrition- continue boost/ensure Anxiety/decreased sleep due to daughter with cancer- can do xanax 0.5mg  1/2-1 at night #30 Vitamin D Def- check level and continue medications.  Smoking cessation discussed  Continue diet and meds as discussed. Further disposition pending results of labs.  Vicie Mutters 1:42 PM

## 2013-11-08 LAB — LIPID PANEL
CHOLESTEROL: 127 mg/dL (ref 0–200)
HDL: 57 mg/dL (ref >39)
LDL Cholesterol: 41 mg/dL (ref 0–99)
Total CHOL/HDL Ratio: 2.2 Ratio
Triglycerides: 147 mg/dL (ref <150)
VLDL: 29 mg/dL (ref 0–40)

## 2013-11-08 LAB — HEPATIC FUNCTION PANEL
ALBUMIN: 4 g/dL (ref 3.5–5.2)
AST: 14 U/L (ref 0–37)
Alkaline Phosphatase: 56 U/L (ref 39–117)
Bilirubin, Direct: 0.1 mg/dL (ref 0.0–0.3)
Indirect Bilirubin: 0.3 mg/dL (ref 0.2–1.2)
Total Bilirubin: 0.4 mg/dL (ref 0.2–1.2)
Total Protein: 6.2 g/dL (ref 6.0–8.3)

## 2013-11-08 LAB — INSULIN, FASTING: Insulin fasting, serum: 17 u[IU]/mL (ref 3–28)

## 2013-11-08 LAB — MAGNESIUM: MAGNESIUM: 1.9 mg/dL (ref 1.5–2.5)

## 2013-11-08 LAB — BASIC METABOLIC PANEL WITH GFR
BUN: 35 mg/dL — ABNORMAL HIGH (ref 6–23)
CALCIUM: 9 mg/dL (ref 8.4–10.5)
CO2: 29 meq/L (ref 19–32)
CREATININE: 1.14 mg/dL — AB (ref 0.50–1.10)
Chloride: 107 mEq/L (ref 96–112)
GFR, Est African American: 52 mL/min — ABNORMAL LOW
GFR, Est Non African American: 45 mL/min — ABNORMAL LOW
GLUCOSE: 94 mg/dL (ref 70–99)
Potassium: 4 mEq/L (ref 3.5–5.3)
Sodium: 144 mEq/L (ref 135–145)

## 2013-11-08 LAB — TSH: TSH: 3.001 u[IU]/mL (ref 0.350–4.500)

## 2013-11-08 LAB — VITAMIN D 25 HYDROXY (VIT D DEFICIENCY, FRACTURES): VIT D 25 HYDROXY: 30 ng/mL (ref 30–89)

## 2013-11-23 ENCOUNTER — Other Ambulatory Visit: Payer: Self-pay | Admitting: Physician Assistant

## 2013-11-24 ENCOUNTER — Other Ambulatory Visit: Payer: Self-pay | Admitting: Internal Medicine

## 2013-11-24 DIAGNOSIS — R110 Nausea: Secondary | ICD-10-CM

## 2013-11-24 MED ORDER — ONDANSETRON 4 MG PO TBDP: ORAL_TABLET | ORAL | Status: DC

## 2013-11-25 ENCOUNTER — Observation Stay (HOSPITAL_COMMUNITY)
Admission: EM | Admit: 2013-11-25 | Discharge: 2013-11-27 | Disposition: A | Discharge status: Home / Self Care | Payer: Medicare Other | Attending: Internal Medicine | Admitting: Internal Medicine

## 2013-11-25 ENCOUNTER — Encounter (HOSPITAL_COMMUNITY): Payer: Self-pay | Admitting: Emergency Medicine

## 2013-11-25 ENCOUNTER — Emergency Department (HOSPITAL_COMMUNITY): Payer: Medicare Other

## 2013-11-25 DIAGNOSIS — Z9010 Acquired absence of unspecified breast and nipple: Secondary | ICD-10-CM | POA: Insufficient documentation

## 2013-11-25 DIAGNOSIS — F32A Depression, unspecified: Secondary | ICD-10-CM | POA: Diagnosis present

## 2013-11-25 DIAGNOSIS — D649 Anemia, unspecified: Secondary | ICD-10-CM | POA: Insufficient documentation

## 2013-11-25 DIAGNOSIS — Z23 Encounter for immunization: Secondary | ICD-10-CM | POA: Insufficient documentation

## 2013-11-25 DIAGNOSIS — R7303 Prediabetes: Secondary | ICD-10-CM | POA: Diagnosis present

## 2013-11-25 DIAGNOSIS — I129 Hypertensive chronic kidney disease with stage 1 through stage 4 chronic kidney disease, or unspecified chronic kidney disease: Secondary | ICD-10-CM | POA: Insufficient documentation

## 2013-11-25 DIAGNOSIS — E559 Vitamin D deficiency, unspecified: Secondary | ICD-10-CM | POA: Insufficient documentation

## 2013-11-25 DIAGNOSIS — I959 Hypotension, unspecified: Secondary | ICD-10-CM | POA: Insufficient documentation

## 2013-11-25 DIAGNOSIS — Z7982 Long term (current) use of aspirin: Secondary | ICD-10-CM | POA: Insufficient documentation

## 2013-11-25 DIAGNOSIS — H9190 Unspecified hearing loss, unspecified ear: Secondary | ICD-10-CM | POA: Insufficient documentation

## 2013-11-25 DIAGNOSIS — J441 Chronic obstructive pulmonary disease with (acute) exacerbation: Principal | ICD-10-CM

## 2013-11-25 DIAGNOSIS — R0902 Hypoxemia: Secondary | ICD-10-CM

## 2013-11-25 DIAGNOSIS — F3289 Other specified depressive episodes: Secondary | ICD-10-CM | POA: Insufficient documentation

## 2013-11-25 DIAGNOSIS — F411 Generalized anxiety disorder: Secondary | ICD-10-CM | POA: Insufficient documentation

## 2013-11-25 DIAGNOSIS — F17200 Nicotine dependence, unspecified, uncomplicated: Secondary | ICD-10-CM | POA: Insufficient documentation

## 2013-11-25 DIAGNOSIS — I10 Essential (primary) hypertension: Secondary | ICD-10-CM | POA: Diagnosis present

## 2013-11-25 DIAGNOSIS — R4182 Altered mental status, unspecified: Secondary | ICD-10-CM | POA: Insufficient documentation

## 2013-11-25 DIAGNOSIS — R109 Unspecified abdominal pain: Secondary | ICD-10-CM | POA: Insufficient documentation

## 2013-11-25 DIAGNOSIS — R7309 Other abnormal glucose: Secondary | ICD-10-CM | POA: Insufficient documentation

## 2013-11-25 DIAGNOSIS — F419 Anxiety disorder, unspecified: Secondary | ICD-10-CM | POA: Diagnosis present

## 2013-11-25 DIAGNOSIS — J9620 Acute and chronic respiratory failure, unspecified whether with hypoxia or hypercapnia: Secondary | ICD-10-CM

## 2013-11-25 DIAGNOSIS — R112 Nausea with vomiting, unspecified: Secondary | ICD-10-CM

## 2013-11-25 DIAGNOSIS — N189 Chronic kidney disease, unspecified: Secondary | ICD-10-CM

## 2013-11-25 DIAGNOSIS — E860 Dehydration: Secondary | ICD-10-CM | POA: Insufficient documentation

## 2013-11-25 DIAGNOSIS — F329 Major depressive disorder, single episode, unspecified: Secondary | ICD-10-CM | POA: Insufficient documentation

## 2013-11-25 DIAGNOSIS — J449 Chronic obstructive pulmonary disease, unspecified: Secondary | ICD-10-CM

## 2013-11-25 DIAGNOSIS — E785 Hyperlipidemia, unspecified: Secondary | ICD-10-CM

## 2013-11-25 DIAGNOSIS — N179 Acute kidney failure, unspecified: Secondary | ICD-10-CM

## 2013-11-25 HISTORY — DX: Shortness of breath: R06.02

## 2013-11-25 HISTORY — DX: Type 2 diabetes mellitus without complications: E11.9

## 2013-11-25 HISTORY — DX: Gastro-esophageal reflux disease without esophagitis: K21.9

## 2013-11-25 HISTORY — DX: Unspecified osteoarthritis, unspecified site: M19.90

## 2013-11-25 LAB — CBC WITH DIFFERENTIAL/PLATELET
BASOS ABS: 0 10*3/uL (ref 0.0–0.1)
BASOS PCT: 1 % (ref 0–1)
EOS ABS: 0.2 10*3/uL (ref 0.0–0.7)
Eosinophils Relative: 4 % (ref 0–5)
HEMATOCRIT: 33.3 % — AB (ref 36.0–46.0)
Hemoglobin: 10.5 g/dL — ABNORMAL LOW (ref 12.0–15.0)
Lymphocytes Relative: 40 % (ref 12–46)
Lymphs Abs: 2.6 10*3/uL (ref 0.7–4.0)
MCH: 29.3 pg (ref 26.0–34.0)
MCHC: 31.5 g/dL (ref 30.0–36.0)
MCV: 93 fL (ref 78.0–100.0)
MONO ABS: 0.7 10*3/uL (ref 0.1–1.0)
Monocytes Relative: 11 % (ref 3–12)
NEUTROS ABS: 2.8 10*3/uL (ref 1.7–7.7)
Neutrophils Relative %: 44 % (ref 43–77)
Platelets: 180 10*3/uL (ref 150–400)
RBC: 3.58 MIL/uL — ABNORMAL LOW (ref 3.87–5.11)
RDW: 16.8 % — ABNORMAL HIGH (ref 11.5–15.5)
WBC: 6.4 10*3/uL (ref 4.0–10.5)

## 2013-11-25 LAB — COMPREHENSIVE METABOLIC PANEL
ALBUMIN: 3.3 g/dL — AB (ref 3.5–5.2)
ALT: 6 U/L (ref 0–35)
AST: 13 U/L (ref 0–37)
Alkaline Phosphatase: 51 U/L (ref 39–117)
BUN: 38 mg/dL — ABNORMAL HIGH (ref 6–23)
CALCIUM: 8.8 mg/dL (ref 8.4–10.5)
CHLORIDE: 104 meq/L (ref 96–112)
CO2: 28 mEq/L (ref 19–32)
CREATININE: 1.54 mg/dL — AB (ref 0.50–1.10)
GFR calc Af Amer: 35 mL/min — ABNORMAL LOW (ref >90)
GFR calc non Af Amer: 30 mL/min — ABNORMAL LOW (ref >90)
Glucose, Bld: 119 mg/dL — ABNORMAL HIGH (ref 70–99)
Potassium: 3.6 mEq/L — ABNORMAL LOW (ref 3.7–5.3)
Sodium: 143 mEq/L (ref 137–147)
TOTAL PROTEIN: 5.8 g/dL — AB (ref 6.0–8.3)
Total Bilirubin: 0.3 mg/dL (ref 0.3–1.2)

## 2013-11-25 LAB — LIPASE, BLOOD: Lipase: 34 U/L (ref 11–59)

## 2013-11-25 LAB — TROPONIN I

## 2013-11-25 LAB — GLUCOSE, CAPILLARY: Glucose-Capillary: 237 mg/dL — ABNORMAL HIGH (ref 70–99)

## 2013-11-25 MED ORDER — ALBUTEROL SULFATE (2.5 MG/3ML) 0.083% IN NEBU: 2.5000 mg | INHALATION_SOLUTION | Freq: Four times a day (QID) | RESPIRATORY_TRACT | Status: DC

## 2013-11-25 MED ORDER — ONDANSETRON HCL 4 MG/2ML IJ SOLN: 4.0000 mg | Freq: Four times a day (QID) | INTRAMUSCULAR | Status: DC | PRN

## 2013-11-25 MED ORDER — METHYLPREDNISOLONE SODIUM SUCC 125 MG IJ SOLR
125.0000 mg | Freq: Once | INTRAMUSCULAR | Status: AC
  Administered 2013-11-25: 125 mg via INTRAVENOUS
  Filled 2013-11-25: qty 2

## 2013-11-25 MED ORDER — PANTOPRAZOLE SODIUM 40 MG PO TBEC
40.0000 mg | DELAYED_RELEASE_TABLET | Freq: Two times a day (BID) | ORAL | Status: DC
Start: 2013-11-25 — End: 2013-11-27
  Administered 2013-11-26 – 2013-11-27 (×3): 40 mg via ORAL
  Filled 2013-11-25 (×3): qty 1

## 2013-11-25 MED ORDER — ASPIRIN EC 81 MG PO TBEC
81.0000 mg | DELAYED_RELEASE_TABLET | Freq: Every day | ORAL | Status: DC
  Administered 2013-11-26 – 2013-11-27 (×2): 81 mg via ORAL
  Filled 2013-11-25 (×3): qty 1

## 2013-11-25 MED ORDER — ALBUTEROL SULFATE (2.5 MG/3ML) 0.083% IN NEBU
5.0000 mg | INHALATION_SOLUTION | Freq: Once | RESPIRATORY_TRACT | Status: AC
  Administered 2013-11-25: 5 mg via RESPIRATORY_TRACT
  Filled 2013-11-25: qty 6

## 2013-11-25 MED ORDER — BISOPROLOL-HYDROCHLOROTHIAZIDE 10-6.25 MG PO TABS
0.5000 | ORAL_TABLET | Freq: Every day | ORAL | Status: DC
  Administered 2013-11-26 – 2013-11-27 (×2): 0.5 via ORAL
  Filled 2013-11-25 (×3): qty 1

## 2013-11-25 MED ORDER — SIMVASTATIN 20 MG PO TABS
20.0000 mg | ORAL_TABLET | Freq: Every day | ORAL | Status: DC
  Administered 2013-11-26: 20 mg via ORAL
  Filled 2013-11-25 (×3): qty 1

## 2013-11-25 MED ORDER — ENOXAPARIN SODIUM 40 MG/0.4ML ~~LOC~~ SOLN: 40.0000 mg | SUBCUTANEOUS | Status: DC

## 2013-11-25 MED ORDER — ALUM & MAG HYDROXIDE-SIMETH 200-200-20 MG/5ML PO SUSP: 30.0000 mL | Freq: Four times a day (QID) | ORAL | Status: DC | PRN

## 2013-11-25 MED ORDER — IPRATROPIUM BROMIDE 0.02 % IN SOLN: 0.5000 mg | Freq: Four times a day (QID) | RESPIRATORY_TRACT | Status: DC

## 2013-11-25 MED ORDER — ACETAMINOPHEN 650 MG RE SUPP: 650.0000 mg | Freq: Four times a day (QID) | RECTAL | Status: DC | PRN

## 2013-11-25 MED ORDER — ALPRAZOLAM 0.5 MG PO TABS
0.5000 mg | ORAL_TABLET | Freq: Three times a day (TID) | ORAL | Status: DC | PRN
  Administered 2013-11-25 – 2013-11-26 (×2): 0.5 mg via ORAL
  Filled 2013-11-25 (×2): qty 1

## 2013-11-25 MED ORDER — SODIUM CHLORIDE 0.9 % IV SOLN
INTRAVENOUS | Status: AC
  Administered 2013-11-25: 18:00:00 via INTRAVENOUS

## 2013-11-25 MED ORDER — LOSARTAN POTASSIUM 50 MG PO TABS
100.0000 mg | ORAL_TABLET | Freq: Every day | ORAL | Status: DC
  Administered 2013-11-26 – 2013-11-27 (×2): 100 mg via ORAL
  Filled 2013-11-25 (×2): qty 2

## 2013-11-25 MED ORDER — GUAIFENESIN-DM 100-10 MG/5ML PO SYRP
5.0000 mL | ORAL_SOLUTION | ORAL | Status: DC | PRN
  Filled 2013-11-25: qty 5

## 2013-11-25 MED ORDER — IPRATROPIUM-ALBUTEROL 0.5-2.5 (3) MG/3ML IN SOLN
3.0000 mL | Freq: Four times a day (QID) | RESPIRATORY_TRACT | Status: DC
Start: 2013-11-25 — End: 2013-11-26
  Administered 2013-11-25 – 2013-11-26 (×2): 3 mL via RESPIRATORY_TRACT
  Filled 2013-11-25 (×2): qty 3

## 2013-11-25 MED ORDER — ALBUTEROL SULFATE (2.5 MG/3ML) 0.083% IN NEBU
2.5000 mg | INHALATION_SOLUTION | RESPIRATORY_TRACT | Status: DC | PRN
  Administered 2013-11-27: 2.5 mg via RESPIRATORY_TRACT

## 2013-11-25 MED ORDER — INSULIN ASPART 100 UNIT/ML ~~LOC~~ SOLN
0.0000 [IU] | Freq: Three times a day (TID) | SUBCUTANEOUS | Status: DC
  Administered 2013-11-26 – 2013-11-27 (×2): 1 [IU] via SUBCUTANEOUS

## 2013-11-25 MED ORDER — ACETAMINOPHEN 325 MG PO TABS: 650.0000 mg | ORAL_TABLET | Freq: Four times a day (QID) | ORAL | Status: DC | PRN

## 2013-11-25 MED ORDER — ONDANSETRON HCL 4 MG PO TABS: 4.0000 mg | ORAL_TABLET | Freq: Four times a day (QID) | ORAL | Status: DC | PRN

## 2013-11-25 MED ORDER — VITAMIN D3 25 MCG (1000 UNIT) PO TABS
1000.0000 [IU] | ORAL_TABLET | Freq: Every day | ORAL | Status: DC
Start: 2013-11-25 — End: 2013-11-27
  Administered 2013-11-26 – 2013-11-27 (×2): 1000 [IU] via ORAL
  Filled 2013-11-25 (×3): qty 1

## 2013-11-25 MED ORDER — MOMETASONE FURO-FORMOTEROL FUM 100-5 MCG/ACT IN AERO
2.0000 | INHALATION_SPRAY | Freq: Two times a day (BID) | RESPIRATORY_TRACT | Status: DC
  Administered 2013-11-25 – 2013-11-27 (×3): 2 via RESPIRATORY_TRACT
  Filled 2013-11-25: qty 8.8

## 2013-11-25 MED ORDER — ENOXAPARIN SODIUM 30 MG/0.3ML ~~LOC~~ SOLN
30.0000 mg | SUBCUTANEOUS | Status: DC
Start: 2013-11-25 — End: 2013-11-27
  Administered 2013-11-25 – 2013-11-26 (×2): 30 mg via SUBCUTANEOUS
  Filled 2013-11-25 (×3): qty 0.3

## 2013-11-25 MED ORDER — IPRATROPIUM BROMIDE 0.02 % IN SOLN
0.5000 mg | Freq: Once | RESPIRATORY_TRACT | Status: AC
  Administered 2013-11-25: 0.5 mg via RESPIRATORY_TRACT
  Filled 2013-11-25: qty 2.5

## 2013-11-25 NOTE — ED Notes (Signed)
Admitting at bedside 

## 2013-11-25 NOTE — ED Provider Notes (Signed)
CSN: [id number]     Arrival date & time 11/25/13  1404 History   First MD Initiated Contact with Patient 11/25/13 1408     Chief Complaint  Patient presents with  . Altered Mental Status     (Consider location/radiation/quality/duration/timing/severity/associated sxs/prior Treatment) Patient is a 80 y.o. female presenting with altered mental status.  Altered Mental Status Presenting symptoms: confusion   Associated symptoms: no abdominal pain, no headaches, no nausea, no rash, no vomiting and no weakness    patient presents with altered mental status. She is reportedly been having episodes of confusion around 15 minutes after she eats. Patient states she feels fine but does admit that his episodes. She states she gets nausea after eating also. She's had a cough. EMS was called and she was found to be somewhat hypotensive with pressure 90 and sats in the upper 80s. No fevers. No dysuria. She does have pain in her upper abdomen after she eats. She is not on oxygen at home. Upon arrival in ER she had sats of 88%. And later had room air sats of 88% also.  Past Medical History  Diagnosis Date  . Hypertension   . Breast cancer   . Hyperlipidemia   . COPD (chronic obstructive pulmonary disease)   . Anxiety   . Depression   . Prediabetes   . Vitamin D deficiency   . Insomnia   . Hypertensive CKD (chronic kidney disease)   . Unspecified protein-calorie malnutrition 06/19/2013   Past Surgical History  Procedure Laterality Date  . Breast surgery  1998  . Appendectomy     Family History  Problem Relation Age of Onset  . Heart disease Mother   . Heart disease Father   . Heart disease Daughter   . Cancer Daughter     lung  . Diabetes Daughter    History  Substance Use Topics  . Smoking status: Current Every Day Smoker -- 0.50 packs/day for 65 years    Types: Cigarettes  . Smokeless tobacco: Never Used  . Alcohol Use: No   OB History   Grav Para Term Preterm Abortions TAB SAB Ect  Mult Living                 Review of Systems  Constitutional: Positive for appetite change. Negative for activity change.  Eyes: Negative for pain.  Respiratory: Positive for cough and shortness of breath. Negative for chest tightness.   Cardiovascular: Negative for chest pain and leg swelling.  Gastrointestinal: Negative for nausea, vomiting, abdominal pain and diarrhea.  Genitourinary: Negative for flank pain.  Musculoskeletal: Negative for back pain and neck stiffness.  Skin: Negative for rash.  Neurological: Negative for weakness, numbness and headaches.  Psychiatric/Behavioral: Positive for confusion. Negative for behavioral problems.      Allergies  Review of patient's allergies indicates no known allergies.  Home Medications   Prior to Admission medications   Medication Sig Start Date End Date Taking? Authorizing Provider  albuterol (PROVENTIL HFA;VENTOLIN HFA) 108 (90 BASE) MCG/ACT inhaler Inhale 2 puffs into the lungs every 4 (four) hours as needed for wheezing. 08/30/13  Yes Melissa R Smith, PA-C  ALPRAZolam (XANAX) 0.5 MG tablet Take 0.5 mg by mouth 3 (three) times daily as needed for anxiety.   Yes Historical Provider, MD  aspirin 325 MG EC tablet Take 325 mg by mouth daily.   Yes Historical Provider, MD  bisoprolol-hydrochlorothiazide (ZIAC) 10-6.25 MG per tablet Take 0.5 tablets by mouth daily.    Yes Historical  Provider, MD  cholecalciferol (VITAMIN D) 1000 UNITS tablet Take 1,000 Units by mouth daily.   Yes Historical Provider, MD  hyoscyamine (LEVSIN, ANASPAZ) 0.125 MG tablet Take 1 tablet (0.125 mg total) by mouth every 4 (four) hours as needed. 10/10/13  Yes Melissa R Smith, PA-C  LORazepam (ATIVAN) 2 MG tablet Take 2 mg by mouth every 6 (six) hours as needed for anxiety.  08/29/13  Yes Historical Provider, MD  losartan (COZAAR) 100 MG tablet Take 1 tablet (100 mg total) by mouth daily. 08/21/13 08/21/14 Yes Melissa R Smith, PA-C  meloxicam (MOBIC) 15 MG tablet Take 15  mg by mouth daily.   Yes Historical Provider, MD  ondansetron (ZOFRAN-ODT) 4 MG disintegrating tablet Take 4 mg by mouth every 4 (four) hours as needed for nausea or vomiting.   Yes Historical Provider, MD  pravastatin (PRAVACHOL) 40 MG tablet Take 40 mg by mouth daily.  10/11/13  Yes Historical Provider, MD  Spacer/Aero-Holding Chambers (AEROCHAMBER PLUS FLO-VU LARGE) MISC 1 each by Other route once. 07/30/13   Freeman Caldron Baker, PA-C   BP 123/65  Pulse 60  Temp(Src) 98.3 F (36.8 C) (Oral)  Resp 17  SpO2 100% Physical Exam  Nursing note and vitals reviewed. Constitutional: She is oriented to person, place, and time. She appears well-developed and well-nourished.  HENT:  Head: Normocephalic and atraumatic.  Eyes: EOM are normal. Pupils are equal, round, and reactive to light.  Neck: Normal range of motion. Neck supple.  Cardiovascular: Normal rate, regular rhythm and normal heart sounds.   No murmur heard. Pulmonary/Chest: No respiratory distress. She has wheezes. She has no rales.  Diffuse harsh breath sounds and prolonged expirations  Abdominal: Soft. Bowel sounds are normal. She exhibits no distension. There is tenderness. There is no rebound and no guarding.  Mild upper abdominal tenderness without rebound or guarding.  Musculoskeletal: Normal range of motion.  Neurological: She is alert and oriented to person, place, and time. No cranial nerve deficit.  Skin: Skin is warm and dry.  Psychiatric: She has a normal mood and affect. Her speech is normal.    ED Course  Procedures (including critical care time) Labs Review Labs Reviewed  CBC WITH DIFFERENTIAL - Abnormal; Notable for the following:    RBC 3.58 (*)    Hemoglobin 10.5 (*)    HCT 33.3 (*)    RDW 16.8 (*)    All other components within normal limits  COMPREHENSIVE METABOLIC PANEL - Abnormal; Notable for the following:    Potassium 3.6 (*)    Glucose, Bld 119 (*)    BUN 38 (*)    Creatinine, Ser 1.54 (*)    Total  Protein 5.8 (*)    Albumin 3.3 (*)    GFR calc non Af Amer 30 (*)    GFR calc Af Amer 35 (*)    All other components within normal limits  LIPASE, BLOOD  TROPONIN I  URINALYSIS, ROUTINE W REFLEX MICROSCOPIC    Imaging Review Dg Chest 2 View  11/25/2013   CLINICAL DATA:  Cough and congestion for the past several months.  EXAM: CHEST  2 VIEW  COMPARISON:  Chest x-ray 09/22/2013.  FINDINGS: Lungs appear hyperexpanded with flattening of the hemidiaphragms, increased retrosternal air space and pruning of the pulmonary vasculature in the periphery, suggestive of underlying COPD. No acute consolidative airspace disease. No pleural effusions. No definite suspicious appearing pulmonary nodules or masses. No evidence of pulmonary edema. Heart size is normal. Upper mediastinal contours are  within normal limits. Surgical clips in the left axillary region and absence of the left breast shadow suggestive of prior left mastectomy and axillary nodal dissection.  IMPRESSION: 1. Changes suggestive of advanced COPD, as detailed above, without definite radiographic evidence of acute cardiopulmonary disease at this time. 2. Atherosclerosis.   Electronically Signed   By: Vinnie Langton M.D.   On: 11/25/2013 15:50     EKG Interpretation   Date/Time:  Saturday November 25 2013 14:42:15 EDT Ventricular Rate:  58 PR Interval:  241 QRS Duration: 88 QT Interval:  500 QTC Calculation: 491 R Axis:   -19 Text Interpretation:  Sinus rhythm Prolonged PR interval LVH with  secondary repolarization abnormality Anterior Q waves, possibly due to LVH  Confirmed by Alvino Chapel  MD, Ovid Curd [id number]) on 11/25/2013 4:29:45 PM      MDM   Final diagnoses:  COPD (chronic obstructive pulmonary disease)  Hypoxia    Patient with some shortness of breath and hypoxia on room air. She has a history of COPD. We'll admit for COPD exacerbation. Also having epigastric pain after eating for months. Also had nausea. No clear cause for  this.    Jasper Riling. Alvino Chapel, MD 11/25/13 1730

## 2013-11-25 NOTE — ED Notes (Signed)
Pt arrived from home by [facility]. Family stated to EMS that after pt eats she becomes confused for approximately 2 months. BP initially 92/54 and last BP 105/56 HR-60 O2sat 88%ra EMS administered O2 2LPM and O2sat increased to 94%. Pt family stated that symptoms will usually resolve on its own.

## 2013-11-25 NOTE — H&P (Signed)
History and Physical  Stacy Byrd YWV:[id number] DOB: February 15, 1931 DOA: 11/25/2013  Referring physician: EDP PCP: Alesia Richards, MD  Outpatient Specialists:  1. None  Chief Complaint: Nausea, vomiting, abdominal pain and difficulty breathing  HPI: Stacy Byrd is a 80 y.o. female with history of COPD, not on home oxygen, ongoing tobacco abuse, hard of hearing, hypertension, hyperlipidemia, anxiety & depression, breast cancer status post left mastectomy, CKD, prediabetes and vitamin D. deficiency, presented to the ED with above complaints. History obtained from patient and daughter at bedside. Patient gives 2-3 month history of intermittent postprandial nausea, vomiting and epigastric abdominal pain. Emesis are nonbloody. No malena. She has occasional diarrhea. Sometime she has no vomiting but just dry heaves. As per daughter, when she has these episodes, sometimes she becomes confused for a short while. Patient states that she has a good appetite and denies weight loss. She is unable to clearly describe the quality of her abdominal pain or details. She has never had a colonoscopy or EGD. She seems to have chronic dyspnea mostly on exertion but in the last 2 days, it seems to have gotten worse where even taking a couple of steps makes her dyspneic. She has a chronic mostly nonproductive cough that has not changed. She denies chest pain, fever or chills. EMS was called and she was found to be hypotensive with systolic blood pressure in the 90s, oxygen saturations 88%. Chest x-ray without acute findings. Hospitalist admission requested.  Review of Systems: All systems reviewed and apart from history of presenting illness, are negative.  Past Medical History  Diagnosis Date  . Hypertension   . Breast cancer   . Hyperlipidemia   . COPD (chronic obstructive pulmonary disease)   . Anxiety   . Depression   . Prediabetes   . Vitamin D deficiency   . Insomnia   . Hypertensive CKD  (chronic kidney disease)   . Unspecified protein-calorie malnutrition 06/19/2013   Past Surgical History  Procedure Laterality Date  . Breast surgery  1998  . Appendectomy     Social History:  reports that she has been smoking Cigarettes.  She has a 32.5 pack-year smoking history. She has never used smokeless tobacco. She reports that she does not drink alcohol or use illicit drugs. Widowed. Lives alone. Independent of activities of daily living.  No Known Allergies  Family History  Problem Relation Age of Onset  . Heart disease Mother   . Heart disease Father   . Heart disease Daughter   . Cancer Daughter     lung  . Diabetes Daughter     Prior to Admission medications   Medication Sig Start Date End Date Taking? Authorizing Provider  albuterol (PROVENTIL HFA;VENTOLIN HFA) 108 (90 BASE) MCG/ACT inhaler Inhale 2 puffs into the lungs every 4 (four) hours as needed for wheezing. 08/30/13  Yes Melissa R Smith, PA-C  ALPRAZolam (XANAX) 0.5 MG tablet Take 0.5 mg by mouth 3 (three) times daily as needed for anxiety.   Yes Historical Provider, MD  aspirin 325 MG EC tablet Take 325 mg by mouth daily.   Yes Historical Provider, MD  bisoprolol-hydrochlorothiazide (ZIAC) 10-6.25 MG per tablet Take 0.5 tablets by mouth daily.    Yes Historical Provider, MD  cholecalciferol (VITAMIN D) 1000 UNITS tablet Take 1,000 Units by mouth daily.   Yes Historical Provider, MD  hyoscyamine (LEVSIN, ANASPAZ) 0.125 MG tablet Take 1 tablet (0.125 mg total) by mouth every 4 (four) hours as needed. 10/10/13  Yes Melissa R Smith, PA-C  LORazepam (ATIVAN) 2 MG tablet Take 2 mg by mouth every 6 (six) hours as needed for anxiety.  08/29/13  Yes Historical Provider, MD  losartan (COZAAR) 100 MG tablet Take 1 tablet (100 mg total) by mouth daily. 08/21/13 08/21/14 Yes Melissa R Smith, PA-C  meloxicam (MOBIC) 15 MG tablet Take 15 mg by mouth daily.   Yes Historical Provider, MD  ondansetron (ZOFRAN-ODT) 4 MG disintegrating  tablet Take 4 mg by mouth every 4 (four) hours as needed for nausea or vomiting.   Yes Historical Provider, MD  pravastatin (PRAVACHOL) 40 MG tablet Take 40 mg by mouth daily.  10/11/13  Yes Historical Provider, MD  Spacer/Aero-Holding Chambers (AEROCHAMBER PLUS FLO-VU LARGE) MISC 1 each by Other route once. 07/30/13   Liam Graham, PA-C   Physical Exam: Filed Vitals:   11/25/13 1600 11/25/13 1615 11/25/13 1630 11/25/13 1645  BP: 121/56 127/55 119/54 123/65  Pulse: 57 57 55 60  Temp:      TempSrc:      Resp: 19 17 13 17   SpO2: 100% 100% 100% 100%     General exam: Moderately built and cachectic female patient, lying comfortably supine on the gurney in no obvious distress.  Head, eyes and ENT: Nontraumatic and normocephalic. Pupils equally reacting to light and accommodation. Oral mucosa dry.  Neck: Supple. No JVD, carotid bruit or thyromegaly.  Lymphatics: No lymphadenopathy.  Respiratory system: Globally reduced breath sounds with scattered occasional posterior lung field rhonchi but no crackles. No increased work of breathing. Able to speak in full sentences.  Cardiovascular system: S1 and S2 heard, RRR. No JVD, murmurs, gallops, clicks or pedal edema.  Gastrointestinal system: Abdomen is nondistended, soft and nontender. Normal bowel sounds heard. No organomegaly or masses appreciated.  Central nervous system: Alert and oriented. No focal neurological deficits. Extremities hard of hearing.  Extremities: Symmetric 5 x 5 power. Peripheral pulses symmetrically felt.   Skin: No rashes or acute findings.  Musculoskeletal system: Negative exam.  Psychiatry: Pleasant and cooperative.   Labs on Admission:  Basic Metabolic Panel:  Recent Labs Lab 11/25/13 1513  NA 143  K 3.6*  CL 104  CO2 28  GLUCOSE 119*  BUN 38*  CREATININE 1.54*  CALCIUM 8.8   Liver Function Tests:  Recent Labs Lab 11/25/13 1513  AST 13  ALT 6  ALKPHOS 51  BILITOT 0.3  PROT 5.8*    ALBUMIN 3.3*    Recent Labs Lab 11/25/13 1513  LIPASE 34   No results found for this basename: AMMONIA,  in the last 168 hours CBC:  Recent Labs Lab 11/25/13 1513  WBC 6.4  NEUTROABS 2.8  HGB 10.5*  HCT 33.3*  MCV 93.0  PLT 180   Cardiac Enzymes:  Recent Labs Lab 11/25/13 1513  TROPONINI <0.30    BNP (last 3 results) No results found for this basename: PROBNP,  in the last 8760 hours CBG: No results found for this basename: GLUCAP,  in the last 168 hours  Radiological Exams on Admission: Dg Chest 2 View  11/25/2013   CLINICAL DATA:  Cough and congestion for the past several months.  EXAM: CHEST  2 VIEW  COMPARISON:  Chest x-ray 09/22/2013.  FINDINGS: Lungs appear hyperexpanded with flattening of the hemidiaphragms, increased retrosternal air space and pruning of the pulmonary vasculature in the periphery, suggestive of underlying COPD. No acute consolidative airspace disease. No pleural effusions. No definite suspicious appearing pulmonary nodules or masses. No evidence  of pulmonary edema. Heart size is normal. Upper mediastinal contours are within normal limits. Surgical clips in the left axillary region and absence of the left breast shadow suggestive of prior left mastectomy and axillary nodal dissection.  IMPRESSION: 1. Changes suggestive of advanced COPD, as detailed above, without definite radiographic evidence of acute cardiopulmonary disease at this time. 2. Atherosclerosis.   Electronically Signed   By: Vinnie Langton M.D.   On: 11/25/2013 15:50    EKG: Independently reviewed. Sinus bradycardia at 58 beats per minute, LVH with repolarization abnormality, T-wave inversion in inferior and lateral leads. Q waves in leads V1-2  Assessment/Plan Principal Problem:   Acute exacerbation of chronic obstructive pulmonary disease (COPD) Active Problems:   Hypertension   Hyperlipidemia   Anxiety   Depression   Prediabetes   Anemia   Dehydration   Renal failure,  acute on chronic   Nausea & vomiting, abdominal pain   COPD exacerbation   Respiratory failure, acute-on-chronic   1. Mild COPD exacerbation: Patient probably has advanced COPD and chronic respiratory failure. Tobacco cessation counseled. Patient has already received a dose of IV Solu-Medrol in the ED. Oxygen, bronchodilator nebulizations and add Advair. I do not believe that she needs any further IV or by mouth steroids but will reassess in a.m. Monitor. No evidence to suggest infective exacerbation and hence hold off on antibiotics. 2. Acute on possible chronic respiratory failure: Secondary to advanced COPD. Management as above and oxygen supplementation. 3. Nausea, vomiting & abdominal pain: Unclear etiology. Patient is on Cox 2 inhibitor and aspirin. DD-gastritis, esophagitis, PUD, mesenteric ischemia versus other etiologies including non benign etiology. Add BID PPI & PRN antiemetics. Continue regular consistency diet and monitor. Lipase normal. Benign abdominal exam findings.? Confusion postprandial- Unclear Etiology-? Secondary to hypoxia/reactive hypoglycemia. Monitor while inpatient. Patient will need GI consultation which can be done outpatient unless she has persistent symptoms while hospitalized. 4. Acute on chronic renal failure: Secondary to dehydration. Patient also on ARB, diuretics and Cox 2 inhibitors. Temporarily hold these medications. IV fluids. 5. Dehydration: Brief IV fluids. 6. Hypertension/hypotension: Probably secondary to intravascular volume depletion. Temporarily hold antihypertensives and hydrate. 7. Anemia: Check stool for occult blood and follow CBC. 8. History of anxiety and depression: Patient apparently stressed by couple of her daughters who have been sick. 9. Prediabetes: Check CBGs and place on SSI. Hemoglobin A1c was 5.9 on 11/07/13    Code Status: Full  Family Communication: Discussed with patient's daughter Ms. Wanda at bedside  Disposition Plan: Home when  medically stable.   Time spent: 60 minutes  Modena Jansky, MD, FACP, [facility]. [facility] Pager [phone]  If 7PM-7AM, please contact night-coverage [email or web address] Password [facility] 11/25/2013, 5:27 PM

## 2013-11-25 NOTE — ED Notes (Signed)
Patient's oxygen saturation dropped to 87% on room air at rest

## 2013-11-26 DIAGNOSIS — E860 Dehydration: Secondary | ICD-10-CM

## 2013-11-26 DIAGNOSIS — D649 Anemia, unspecified: Secondary | ICD-10-CM

## 2013-11-26 LAB — URINALYSIS, ROUTINE W REFLEX MICROSCOPIC
Bilirubin Urine: NEGATIVE
Glucose, UA: NEGATIVE mg/dL
Hgb urine dipstick: NEGATIVE
Ketones, ur: NEGATIVE mg/dL
Leukocytes, UA: NEGATIVE
NITRITE: NEGATIVE
PH: 6 (ref 5.0–8.0)
Protein, ur: NEGATIVE mg/dL
SPECIFIC GRAVITY, URINE: 1.025 (ref 1.005–1.030)
Urobilinogen, UA: 0.2 mg/dL (ref 0.0–1.0)

## 2013-11-26 LAB — GLUCOSE, CAPILLARY
GLUCOSE-CAPILLARY: 105 mg/dL — AB (ref 70–99)
Glucose-Capillary: 137 mg/dL — ABNORMAL HIGH (ref 70–99)
Glucose-Capillary: 123 mg/dL — ABNORMAL HIGH (ref 70–99)
Glucose-Capillary: 106 mg/dL — ABNORMAL HIGH (ref 70–99)

## 2013-11-26 LAB — CBC
HCT: 34.9 % — ABNORMAL LOW (ref 36.0–46.0)
Hemoglobin: 10.8 g/dL — ABNORMAL LOW (ref 12.0–15.0)
MCH: 29.7 pg (ref 26.0–34.0)
MCHC: 30.9 g/dL (ref 30.0–36.0)
MCV: 95.9 fL (ref 78.0–100.0)
Platelets: 177 10*3/uL (ref 150–400)
RBC: 3.64 MIL/uL — ABNORMAL LOW (ref 3.87–5.11)
RDW: 16.5 % — ABNORMAL HIGH (ref 11.5–15.5)
WBC: 5.5 10*3/uL (ref 4.0–10.5)

## 2013-11-26 LAB — OCCULT BLOOD X 1 CARD TO LAB, STOOL: Fecal Occult Bld: NEGATIVE

## 2013-11-26 LAB — BASIC METABOLIC PANEL
BUN: 37 mg/dL — ABNORMAL HIGH (ref 6–23)
CHLORIDE: 109 meq/L (ref 96–112)
CO2: 25 mEq/L (ref 19–32)
Calcium: 9.1 mg/dL (ref 8.4–10.5)
Creatinine, Ser: 1.15 mg/dL — ABNORMAL HIGH (ref 0.50–1.10)
GFR calc Af Amer: 50 mL/min — ABNORMAL LOW (ref >90)
GFR calc non Af Amer: 43 mL/min — ABNORMAL LOW (ref >90)
GLUCOSE: 142 mg/dL — AB (ref 70–99)
Potassium: 4.1 mEq/L (ref 3.7–5.3)
SODIUM: 146 meq/L (ref 137–147)

## 2013-11-26 MED ORDER — IPRATROPIUM-ALBUTEROL 0.5-2.5 (3) MG/3ML IN SOLN
3.0000 mL | Freq: Three times a day (TID) | RESPIRATORY_TRACT | Status: DC
  Administered 2013-11-26 – 2013-11-27 (×3): 3 mL via RESPIRATORY_TRACT
  Filled 2013-11-26 (×4): qty 3

## 2013-11-26 MED ORDER — PNEUMOCOCCAL VAC POLYVALENT 25 MCG/0.5ML IJ INJ
0.5000 mL | INJECTION | INTRAMUSCULAR | Status: AC
  Administered 2013-11-26: 0.5 mL via INTRAMUSCULAR
  Filled 2013-11-26: qty 0.5

## 2013-11-26 NOTE — Progress Notes (Signed)
SaO2 mostly 91-94% when up walking. Dipped to 89% once but came up to 92 again right away.

## 2013-11-26 NOTE — Progress Notes (Signed)
Utilization review completed.  

## 2013-11-26 NOTE — Evaluation (Signed)
Physical Therapy Evaluation and [place] Patient Details Name: Stacy Byrd MRN: [id number] DOB: [birth year] Today's Date: 11/26/2013   History of Present Illness    80 y.o. female with history of COPD, not on home oxygen, ongoing tobacco abuse, hard of hearing, hypertension, hyperlipidemia, anxiety & depression, breast cancer status post left mastectomy, CKD, prediabetes and vitamin D. deficiency, presented to the ED with complaints of chronic intermittent postprandial nausea, vomiting, abdominal pain and? Altered mental status and worsening dyspnea. EMS was called and she was found to be hypotensive with systolic blood pressure in the 90s, oxygen saturations 88%. Chest x-ray without acute findings.    Clinical Impression  Pt presents with mild mobility limitations related to balance function correctable with RW.  Pt motivated to return home, agreeable to HHPT for balance retraining and energy conservation education.  No acute needs identified, will sign off.    Follow Up Recommendations Home health PT;Supervision - Intermittent    Equipment Recommendations  Rolling walker with 5" wheels    Recommendations for Other Services       Precautions / Restrictions Precautions Precautions: Fall Precaution Comments: history or falls per daughter, with some injury and variable performance due to chronic disease      Mobility  Bed Mobility Overal bed mobility: Independent                Transfers Overall transfer level: Needs assistance   Transfers: Sit to/from Stand Sit to Stand: Supervision         General transfer comment: observed for safety with minimal instability able to self correct.  Educated on risks of 'jumping up and going' with instability   Ambulation/Gait Ambulation/Gait assistance: Supervision Ambulation Distance (Feet): 150 Feet Assistive device: Rolling walker (2 wheeled) Gait Pattern/deviations: Step-through pattern     General Gait Details: generally  tends to cross over step when turning corners; quick pace maybe riskier with fall history and COPD/energy conservation however no observable increase in dyspnea or coughing  Stairs Stairs: Yes Stairs assistance: Supervision Stair Management: Forwards;No rails Number of Stairs: 3 General stair comments: generally ok with supervision to min guard with no rails as at home  Wheelchair Mobility    Modified Rankin (Stroke Patients Only)       Balance Overall balance assessment: History of Falls;Needs assistance Sitting-balance support: No upper extremity supported;Feet supported Sitting balance-Leahy Scale: Good     Standing balance support: No upper extremity supported;During functional activity Standing balance-Leahy Scale: Good     Single Leg Stance - Left Leg: 10 (incr sway with need for hands on to maintain COG over BOS)   Tandem Stance - Left Leg: 20 (as with SLS) Rhomberg - Eyes Opened: 30 Rhomberg - Eyes Closed: 30 (noting incr sway but no LOB)                 Pertinent Vitals/Pain no apparent distress     Home Living Family/patient expects to be discharged to:: Private residence Living Arrangements: Children (daughter lives with her past 3-4 months) Available Help at Discharge: Family;Available 24 hours/day Type of Home: House Home Access: Stairs to enter Entrance Stairs-Rails: None Entrance Stairs-Number of Steps: 2 Home Layout: One level Home Equipment: None      Prior Function Level of Independence: Independent               Hand Dominance        Extremity/Trunk Assessment   Upper Extremity Assessment: Overall WFL for tasks assessed  Lower Extremity Assessment: Overall WFL for tasks assessed         Communication   Communication: No difficulties;HOH  Cognition Arousal/Alertness: Awake/alert Behavior During Therapy: WFL for tasks assessed/performed Overall Cognitive Status: Within Functional Limits for tasks  assessed                      General Comments      Exercises        Assessment/Plan    PT Assessment Patient needs continued PT services;All further PT needs can be met in the next venue of care  PT Diagnosis Difficulty walking   PT Problem List Decreased balance;Decreased knowledge of use of DME;Other (comment) (decr knowledge of chronic disease self management )  PT Treatment Interventions Balance training;Functional mobility training;Therapeutic activities;Gait training;DME instruction;Patient/family education (recommended in postacute venue (HHPT))   PT Goals (Current goals can be found in the Care Plan section) Acute Rehab PT Goals Patient Stated Goal: not need help PT Goal Formulation: No goals set, d/c therapy    Frequency     Barriers to discharge        Co-evaluation               End of Session   Activity Tolerance: Patient tolerated treatment well Patient left: in chair;with call bell/phone within reach;with family/visitor present Nurse Communication: Mobility status    Functional Assessment Tool Used: clinical judgement Functional Limitation: Mobility: Walking and moving around Mobility: Walking and Moving Around Current Status (W1027): At least 40 percent but less than 60 percent impaired, limited or restricted Mobility: Walking and Moving Around Goal Status [phone]): At least 1 percent but less than 20 percent impaired, limited or restricted Mobility: Walking and Moving Around Discharge Status [phone]): At least 40 percent but less than 60 percent impaired, limited or restricted    Time: 1550-1615 PT Time Calculation (min): 25 min   Charges:   PT Evaluation $Initial PT Evaluation Tier I: 1 Procedure PT Treatments $Gait Training: 8-22 mins   PT G Codes:   Functional Assessment Tool Used: clinical judgement Functional Limitation: Mobility: Walking and moving around    [employer] 11/26/2013, 4:17 PM

## 2013-11-26 NOTE — Progress Notes (Signed)
PROGRESS NOTE    Stacy Byrd YBO:[id number] DOB: 09-08-1930 DOA: 11/25/2013 PCP: Alesia Richards, MD  HPI/Brief narrative 80 y.o. female with history of COPD, not on home oxygen, ongoing tobacco abuse, hard of hearing, hypertension, hyperlipidemia, anxiety & depression, breast cancer status post left mastectomy, CKD, prediabetes and vitamin D. deficiency, presented to the ED with complaints of chronic intermittent postprandial nausea, vomiting, abdominal pain and? Altered mental status and worsening dyspnea. EMS was called and she was found to be hypotensive with systolic blood pressure in the 90s, oxygen saturations 88%. Chest x-ray without acute findings. Hospitalist admission requested.    Assessment/Plan:  1. Mild COPD exacerbation: Patient probably has advanced COPD and chronic respiratory failure. Tobacco cessation counseled. Patient received a dose of IV Solu-Medrol in the ED. Oxygen, bronchodilator nebulizations and added Advair. I do not believe that she needs any further IV or by mouth steroids. Monitor. No evidence to suggest infective exacerbation and hence hold off on antibiotics. 2. Acute on possible chronic respiratory failure: Secondary to advanced COPD. Management as above and oxygen supplementation. Today's evaluation suggest that she may not qualify for home oxygen but will reassess in the a.m. 3. Nausea, vomiting & abdominal pain: Unclear etiology. Patient is on Cox 2 inhibitor and aspirin. DD-gastritis, esophagitis, PUD, mesenteric ischemia versus other etiologies including non benign etiology. Add BID PPI & PRN antiemetics. Continue regular consistency diet and monitor. Lipase normal. Benign abdominal exam findings.? Confusion postprandial- Unclear Etiology-? Secondary to hypoxia/reactive hypoglycemia. Monitor while inpatient. Patient will need GI consultation which can be done outpatient unless she has persistent symptoms while hospitalized. No further nausea,  vomiting or abdominal pain in the hospital. Tolerating diet. 4. Acute on chronic renal failure: Secondary to dehydration. Patient also on ARB, diuretics and Cox 2 inhibitors. Temporarily hold these medications. IV fluids. Resolved. 5. Dehydration: Brief IV fluids. Resolved. 6. Hypertension/hypotension: Probably secondary to intravascular volume depletion. Temporarily held antihypertensives and hydrated. Hypotension resolved. Resume home antihypertensives. 7. Anemia: Check stool for occult blood and follow CBC. Hemoglobin stable. 8. History of anxiety and depression: Patient apparently stressed by couple of her daughters who have been sick. 9. Prediabetes: Check CBGs and place on SSI. Hemoglobin A1c was 5.9 on 11/07/13   Code Status: Full Family Communication: Discussed with daughter at bedside Disposition Plan: Home possibly 4/20   Consultants:  None  Procedures:  None  Antibiotics:  None   Subjective: Feels better. Denies dyspnea, nausea, vomiting or abdominal pain. Tolerating diet.  Objective: Filed Vitals:   11/25/13 2116 11/26/13 0543 11/26/13 1133 11/26/13 1155  BP: 140/56 145/62    Pulse: 75 76 76   Temp: 97.8 F (36.6 C) 97.7 F (36.5 C)    TempSrc: Oral Oral    Resp: 18 18    SpO2: 95% 99% 94% 92%    Intake/Output Summary (Last 24 hours) at 11/26/13 1405 Last data filed at 11/26/13 1100  Gross per 24 hour  Intake    660 ml  Output      0 ml  Net    660 ml   There were no vitals filed for this visit.   Exam:  General exam: Elderly frail female lying comfortably in bed. Mucosa moist. Respiratory system: Distant breath sounds but clear to auscultation. No increased work of breathing. Cardiovascular system: S1 & S2 heard, RRR. No JVD, murmurs, gallops, clicks or pedal edema. Gastrointestinal system: Abdomen is nondistended, soft and nontender. Normal bowel sounds heard. Central nervous system: Alert and oriented. No  focal neurological  deficits. Extremities: Symmetric 5 x 5 power.   Data Reviewed: Basic Metabolic Panel:  Recent Labs Lab 11/25/13 1513 11/26/13 0737  NA 143 146  K 3.6* 4.1  CL 104 109  CO2 28 25  GLUCOSE 119* 142*  BUN 38* 37*  CREATININE 1.54* 1.15*  CALCIUM 8.8 9.1   Liver Function Tests:  Recent Labs Lab 11/25/13 1513  AST 13  ALT 6  ALKPHOS 51  BILITOT 0.3  PROT 5.8*  ALBUMIN 3.3*    Recent Labs Lab 11/25/13 1513  LIPASE 34   No results found for this basename: AMMONIA,  in the last 168 hours CBC:  Recent Labs Lab 11/25/13 1513 11/26/13 0737  WBC 6.4 5.5  NEUTROABS 2.8  --   HGB 10.5* 10.8*  HCT 33.3* 34.9*  MCV 93.0 95.9  PLT 180 177   Cardiac Enzymes:  Recent Labs Lab 11/25/13 1513  TROPONINI <0.30   BNP (last 3 results) No results found for this basename: PROBNP,  in the last 8760 hours CBG:  Recent Labs Lab 11/25/13 2119 11/26/13 0702 11/26/13 1122  GLUCAP 237* 137* 123*    No results found for this or any previous visit (from the past 240 hour(s)).      Studies: Dg Chest 2 View  11/25/2013   CLINICAL DATA:  Cough and congestion for the past several months.  EXAM: CHEST  2 VIEW  COMPARISON:  Chest x-ray 09/22/2013.  FINDINGS: Lungs appear hyperexpanded with flattening of the hemidiaphragms, increased retrosternal air space and pruning of the pulmonary vasculature in the periphery, suggestive of underlying COPD. No acute consolidative airspace disease. No pleural effusions. No definite suspicious appearing pulmonary nodules or masses. No evidence of pulmonary edema. Heart size is normal. Upper mediastinal contours are within normal limits. Surgical clips in the left axillary region and absence of the left breast shadow suggestive of prior left mastectomy and axillary nodal dissection.  IMPRESSION: 1. Changes suggestive of advanced COPD, as detailed above, without definite radiographic evidence of acute cardiopulmonary disease at this time. 2.  Atherosclerosis.   Electronically Signed   By: Vinnie Langton M.D.   On: 11/25/2013 15:50        Scheduled Meds: . aspirin EC  81 mg Oral Daily  . bisoprolol-hydrochlorothiazide  0.5 tablet Oral Daily  . cholecalciferol  1,000 Units Oral Daily  . enoxaparin (LOVENOX) injection  30 mg Subcutaneous Q24H  . insulin aspart  0-9 Units Subcutaneous TID WC  . ipratropium-albuterol  3 mL Nebulization TID  . losartan  100 mg Oral Daily  . mometasone-formoterol  2 puff Inhalation BID  . pantoprazole  40 mg Oral BID AC  . pneumococcal 23 valent vaccine  0.5 mL Intramuscular Tomorrow-1000  . simvastatin  20 mg Oral q1800   Continuous Infusions:   Principal Problem:   Acute exacerbation of chronic obstructive pulmonary disease (COPD) Active Problems:   Hypertension   Hyperlipidemia   Anxiety   Depression   Prediabetes   Anemia   Dehydration   Renal failure, acute on chronic   Nausea & vomiting, abdominal pain   COPD exacerbation   Respiratory failure, acute-on-chronic    Time spent: 25 mins    Modena Jansky, MD, FACP, [facility]. [facility] Pager [phone]  If 7PM-7AM, please contact night-coverage [email or web address] Password [email or web address] 11/26/2013, 2:05 PM    LOS: 1 day

## 2013-11-27 ENCOUNTER — Encounter (HOSPITAL_COMMUNITY): Payer: Self-pay | Admitting: *Deleted

## 2013-11-27 LAB — BASIC METABOLIC PANEL
BUN: 30 mg/dL — ABNORMAL HIGH (ref 6–23)
CO2: 26 mEq/L (ref 19–32)
Calcium: 9.3 mg/dL (ref 8.4–10.5)
Chloride: 104 mEq/L (ref 96–112)
Creatinine, Ser: 0.92 mg/dL (ref 0.50–1.10)
GFR calc Af Amer: 66 mL/min — ABNORMAL LOW (ref >90)
GFR, EST NON AFRICAN AMERICAN: 57 mL/min — AB (ref >90)
GLUCOSE: 95 mg/dL (ref 70–99)
POTASSIUM: 4.1 meq/L (ref 3.7–5.3)
Sodium: 142 mEq/L (ref 137–147)

## 2013-11-27 LAB — GLUCOSE, CAPILLARY
GLUCOSE-CAPILLARY: 127 mg/dL — AB (ref 70–99)
Glucose-Capillary: 89 mg/dL (ref 70–99)

## 2013-11-27 MED ORDER — TIOTROPIUM BROMIDE MONOHYDRATE 18 MCG IN CAPS: 18.0000 ug | ORAL_CAPSULE | Freq: Every day | RESPIRATORY_TRACT | Status: DC

## 2013-11-27 MED ORDER — ACETAMINOPHEN 325 MG PO TABS: 650.0000 mg | ORAL_TABLET | Freq: Four times a day (QID) | ORAL | Status: DC | PRN

## 2013-11-27 MED ORDER — ASPIRIN 81 MG PO TBEC: 81.0000 mg | DELAYED_RELEASE_TABLET | Freq: Every day | ORAL | Status: AC

## 2013-11-27 MED ORDER — FLUTICASONE-SALMETEROL 250-50 MCG/DOSE IN AEPB: 1.0000 | INHALATION_SPRAY | Freq: Two times a day (BID) | RESPIRATORY_TRACT | Status: DC

## 2013-11-27 MED ORDER — ONDANSETRON 4 MG PO TBDP: 4.0000 mg | ORAL_TABLET | Freq: Three times a day (TID) | ORAL | Status: DC | PRN

## 2013-11-27 MED ORDER — PANTOPRAZOLE SODIUM 40 MG PO TBEC: 40.0000 mg | DELAYED_RELEASE_TABLET | Freq: Every day | ORAL | Status: AC

## 2013-11-27 NOTE — Progress Notes (Signed)
Pts O2 sat was 93/RA while at rest. While walking in the hall Pts O2 sat remained at 93% on room air. O2 sat was 91% on RA at its lowest point with increased activity. Will continue to monitor Pts oxygen and resp status.

## 2013-11-27 NOTE — Care Management Note (Signed)
CARE MANAGEMENT NOTE 11/27/2013  Patient:  Stacy Byrd, Stacy Byrd   Account Number:  [id number]  Date Initiated:  11/27/2013  Documentation initiated by:  Ricki Miller  Subjective/Objective Assessment:   80 yr old female admitted with COPD exacerbation.     Action/Plan:   Case manager spoke with patient and her sister concerning home health and DME needs. [facility] sellected, referral called to [facility], [place]. Patient requested that walker be a rollator.   Anticipated DC Date:  11/27/2013   Anticipated DC Plan:  [facility]  CM consult      [place]   Choice offered to / List presented to:  C-1 Patient   DME arranged  [facility]      DME agency  [place] arranged  [place].   Status of service:  Completed, signed off Medicare Important Message given?   (If response is "NO", the following Medicare IM given date fields will be blank) Date Medicare IM given:   Date Additional Medicare IM given:    Discharge Disposition:  [place]  Per UR Regulation:

## 2013-11-27 NOTE — Discharge Summary (Signed)
Physician Discharge Summary  Stacy Byrd NFA:[id number] DOB: 1931/03/01 DOA: 11/25/2013  PCP: Alesia Richards, MD  Admit date: 11/25/2013 Discharge date: 11/27/2013  Time spent: Less than 30 minutes  Recommendations for Outpatient Follow-up:  1. Home health PT and rolling walker with 5 inch wheels. 2. Dr. Unk Pinto, PCP in one week with repeat labs (CBC & BMP). 3. Recommend outpatient gastroenterology and pulmonology consultations.  Discharge Diagnoses:  Principal Problem:   Acute exacerbation of chronic obstructive pulmonary disease (COPD) Active Problems:   Hypertension   Hyperlipidemia   Anxiety   Depression   Prediabetes   Anemia   Dehydration   Renal failure, acute on chronic   Nausea & vomiting, abdominal pain   COPD exacerbation   Respiratory failure, acute-on-chronic   Discharge Condition: Improved & Stable  Diet recommendation: Heart healthy diet.  There were no vitals filed for this visit.  History of present illness:  80 y.o. female with history of COPD, not on home oxygen, ongoing tobacco abuse, hard of hearing, hypertension, hyperlipidemia, anxiety & depression, breast cancer status post left mastectomy, CKD, prediabetes and vitamin D. deficiency, presented to the ED with complaints of chronic intermittent postprandial nausea, vomiting, abdominal pain and? Altered mental status and worsening dyspnea. EMS was called and she was found to be hypotensive with systolic blood pressure in the 90s, oxygen saturations 88%. Chest x-ray without acute findings. Hospitalist admission requested.   Hospital Course:   1. Mild COPD exacerbation: Patient probably has advanced COPD and chronic respiratory failure. Tobacco cessation counseled. Patient received a dose of IV Solu-Medrol in the ED. Oxygen, bronchodilator nebulizations and added Advair. I do not believe that she needs any further IV or by mouth steroids. Monitor. No evidence to suggest infective  exacerbation and hence hold off on antibiotics. COPD exacerbation has resolved. Patient will be discharged on Advair, Spiriva and when necessary albuterol inhaler. Recommend outpatient pulmonology consultation for formal PFTs and management. 2. Acute on possible chronic respiratory failure: Secondary to advanced COPD. Management as above and oxygen supplementation. Oxygen saturations greater than 90% at rest and with activity on room air. Does not qualify for home oxygen. 3. Nausea, vomiting & abdominal pain: Unclear etiology. Patient is on Cox 2 inhibitor and aspirin. DD-gastritis, esophagitis, PUD, mesenteric ischemia versus other etiologies including non benign etiology. Add BID PPI & PRN antiemetics. Continue regular consistency diet and monitor. Lipase normal. Benign abdominal exam findings.? Confusion postprandial- Unclear Etiology-? Secondary to hypoxia/reactive hypoglycemia. Monitor while inpatient. Patient has had no further nausea, vomiting, abdominal pain in the hospital. She has tolerated diet. We'll discharge on Protonix once daily. Reduced aspirin to 81 mg daily. Discontinued Cox 2 inhibitor. Counseled regarding cessation of NSAIDs. Recommend outpatient GI consultation-patient has not had a screening colonoscopy. She may need EGD too. FOBT negative. 4. Acute on chronic renal failure: Secondary to dehydration. Patient also on ARB, diuretics and Cox 2 inhibitors. Temporarily held these medications. IV fluids. Resolved. 5. Dehydration: Brief IV fluids. Resolved. 6. Hypertension/hypotension: Probably secondary to intravascular volume depletion. Temporarily held antihypertensives and hydrated. Hypotension resolved. Resumed home antihypertensives. 7. Anemia: Hemoglobin stable. FOBT negative. Outpatient GI consultation. 8. History of anxiety and depression: Patient apparently stressed by couple of her daughters who have been sick. 9. Prediabetes: Hemoglobin A1c was 5.9 on  11/07/13   Consultations:  None  Procedures:  None    Discharge Exam:  Complaints:  Denies complaints. No dyspnea. Tolerating diet without nausea, vomiting, abdominal pain. Anxious to go home.  Filed Vitals:  11/26/13 1728 11/26/13 2117 11/27/13 0601 11/27/13 0854  BP:  158/71 169/78   Pulse:  76 62   Temp:  98.1 F (36.7 C) 97.7 F (36.5 C)   TempSrc:  Oral Oral   Resp:  18 18   SpO2: 92% 96% 98% 97%    General exam: Elderly frail female sitting up comfortably in bed this AM. Respiratory system: Distant breath sounds but clear to auscultation. No increased work of breathing.  Cardiovascular system: S1 & S2 heard, RRR. No JVD, murmurs, gallops, clicks or pedal edema.  Gastrointestinal system: Abdomen is nondistended, soft and nontender. Normal bowel sounds heard.  Central nervous system: Alert and oriented. No focal neurological deficits.  Extremities: Symmetric 5 x 5 power.   Discharge Instructions      Discharge Orders   Future Appointments Provider Department Dept Phone   02/08/2014 1:30 PM Christean Leaf [facility] [phone]   08/22/2014 9:00 AM Ardis Hughs, PA-C [facility] [phone]   Future Orders Complete By Expires   Call MD for:  difficulty breathing, headache or visual disturbances  As directed    Call MD for:  persistant nausea and vomiting  As directed    Diet - low sodium heart healthy  As directed    Increase activity slowly  As directed        Medication List    STOP taking these medications       LORazepam 2 MG tablet  Commonly known as:  ATIVAN     meloxicam 15 MG tablet  Commonly known as:  MOBIC      TAKE these medications       acetaminophen 325 MG tablet  Commonly known as:  TYLENOL  Take 2 tablets (650 mg total) by mouth every 6 (six) hours as needed for mild pain, moderate pain, fever or headache (or Fever >/= 101).     AEROCHAMBER PLUS  FLO-VU LARGE Misc  1 each by Other route once.     albuterol 108 (90 BASE) MCG/ACT inhaler  Commonly known as:  PROVENTIL HFA;VENTOLIN HFA  Inhale 2 puffs into the lungs every 4 (four) hours as needed for wheezing.     ALPRAZolam 0.5 MG tablet  Commonly known as:  XANAX  Take 0.5 mg by mouth 3 (three) times daily as needed for anxiety.     aspirin 81 MG EC tablet  Take 1 tablet (81 mg total) by mouth daily.     bisoprolol-hydrochlorothiazide 10-6.25 MG per tablet  Commonly known as:  ZIAC  Take 0.5 tablets by mouth daily.     cholecalciferol 1000 UNITS tablet  Commonly known as:  VITAMIN D  Take 1,000 Units by mouth daily.     Fluticasone-Salmeterol 250-50 MCG/DOSE Aepb  Commonly known as:  ADVAIR DISKUS  Inhale 1 puff into the lungs 2 (two) times daily.     hyoscyamine 0.125 MG tablet  Commonly known as:  LEVSIN, ANASPAZ  Take 1 tablet (0.125 mg total) by mouth every 4 (four) hours as needed.     losartan 100 MG tablet  Commonly known as:  COZAAR  Take 1 tablet (100 mg total) by mouth daily.     ondansetron 4 MG disintegrating tablet  Commonly known as:  ZOFRAN-ODT  Take 1 tablet (4 mg total) by mouth every 8 (eight) hours as needed for nausea or vomiting.     pantoprazole 40 MG tablet  Commonly known as:  PROTONIX  Take  1 tablet (40 mg total) by mouth daily.     pravastatin 40 MG tablet  Commonly known as:  PRAVACHOL  Take 40 mg by mouth daily.     tiotropium 18 MCG inhalation capsule  Commonly known as:  SPIRIVA HANDIHALER  Place 1 capsule (18 mcg total) into inhaler and inhale daily.       Follow-up Information   Follow up with [facility]. (Someone from [facility] will contact you concerning the start date and time for physical therapy.)    Contact information:   [address] [place] [address] [phone]       Follow up with Alesia Richards, MD. Schedule an appointment as soon as possible for a visit in 1  week. (To be seen with repeat labs (CBC & BMP))    Specialty:  Internal Medicine   Contact information:   [address] [place] [address] [phone]        The results of significant diagnostics from this hospitalization (including imaging, microbiology, ancillary and laboratory) are listed below for reference.    Significant Diagnostic Studies: Dg Chest 2 View  11/25/2013   CLINICAL DATA:  Cough and congestion for the past several months.  EXAM: CHEST  2 VIEW  COMPARISON:  Chest x-ray 09/22/2013.  FINDINGS: Lungs appear hyperexpanded with flattening of the hemidiaphragms, increased retrosternal air space and pruning of the pulmonary vasculature in the periphery, suggestive of underlying COPD. No acute consolidative airspace disease. No pleural effusions. No definite suspicious appearing pulmonary nodules or masses. No evidence of pulmonary edema. Heart size is normal. Upper mediastinal contours are within normal limits. Surgical clips in the left axillary region and absence of the left breast shadow suggestive of prior left mastectomy and axillary nodal dissection.  IMPRESSION: 1. Changes suggestive of advanced COPD, as detailed above, without definite radiographic evidence of acute cardiopulmonary disease at this time. 2. Atherosclerosis.   Electronically Signed   By: Vinnie Langton M.D.   On: 11/25/2013 15:50    Microbiology: No results found for this or any previous visit (from the past 240 hour(s)).   Labs: Basic Metabolic Panel:  Recent Labs Lab 11/25/13 1513 11/26/13 0737 11/27/13 0415  NA 143 146 142  K 3.6* 4.1 4.1  CL 104 109 104  CO2 28 25 26   GLUCOSE 119* 142* 95  BUN 38* 37* 30*  CREATININE 1.54* 1.15* 0.92  CALCIUM 8.8 9.1 9.3   Liver Function Tests:  Recent Labs Lab 11/25/13 1513  AST 13  ALT 6  ALKPHOS 51  BILITOT 0.3  PROT 5.8*  ALBUMIN 3.3*    Recent Labs Lab 11/25/13 1513  LIPASE 34   No results found for this  basename: AMMONIA,  in the last 168 hours CBC:  Recent Labs Lab 11/25/13 1513 11/26/13 0737  WBC 6.4 5.5  NEUTROABS 2.8  --   HGB 10.5* 10.8*  HCT 33.3* 34.9*  MCV 93.0 95.9  PLT 180 177   Cardiac Enzymes:  Recent Labs Lab 11/25/13 1513  TROPONINI <0.30   BNP: BNP (last 3 results) No results found for this basename: PROBNP,  in the last 8760 hours CBG:  Recent Labs Lab 11/26/13 1122 11/26/13 1630 11/26/13 2120 11/27/13 0654 11/27/13 1146  GLUCAP 123* 106* 105* 89 127*    Additional labs: 1. FOBT: Negative   Signed:  Modena Jansky, MD, FACP, [facility]. [facility] Pager [phone]  If 7PM-7AM, please contact night-coverage [email or web address] Password  [email or web address] 11/27/2013, 1:03 PM

## 2013-11-28 ENCOUNTER — Other Ambulatory Visit: Payer: Self-pay | Admitting: *Deleted

## 2013-11-28 MED ORDER — HYOSCYAMINE SULFATE 0.125 MG PO TABS: 0.1250 mg | ORAL_TABLET | ORAL | Status: DC | PRN

## 2013-11-30 ENCOUNTER — Ambulatory Visit (INDEPENDENT_AMBULATORY_CARE_PROVIDER_SITE_OTHER): Payer: Medicare Other | Admitting: Internal Medicine

## 2013-11-30 ENCOUNTER — Encounter: Payer: Self-pay | Admitting: Internal Medicine

## 2013-11-30 VITALS — BP 144/90 | HR 72 | Temp 98.4°F | Resp 18 | Wt 96.0 lb

## 2013-11-30 DIAGNOSIS — I10 Essential (primary) hypertension: Secondary | ICD-10-CM

## 2013-11-30 DIAGNOSIS — J449 Chronic obstructive pulmonary disease, unspecified: Secondary | ICD-10-CM

## 2013-11-30 DIAGNOSIS — R110 Nausea: Secondary | ICD-10-CM

## 2013-11-30 DIAGNOSIS — J4489 Other specified chronic obstructive pulmonary disease: Secondary | ICD-10-CM

## 2013-11-30 DIAGNOSIS — Z79899 Other long term (current) drug therapy: Secondary | ICD-10-CM

## 2013-11-30 MED ORDER — PROCHLORPERAZINE MALEATE 5 MG PO TABS: ORAL_TABLET | ORAL | Status: DC

## 2013-11-30 NOTE — Progress Notes (Signed)
Subjective:    Patient ID: Stacy Byrd, female    DOB: March 14, 1931, 80 y.o.   MRN: [id number]  HPI  very nice 80 yo WWF with HTN, COPD, CKD, Anxiety/Depression was recently hospitalized 4/18-20  For an acute exacerbation of COPD and dehydration with renal compromise. Patient was Tx'd with parenteral steroids, Abx's, and bronchodilator Tx's. At admission, she was reported to have O2 Sat of 87%. Now she presents with dyspnea w/ activity or exertion. She continues to have productive cough and wheezing. Unfortunately she also continues to smoke. She's had no chest pain, fever or chills . She was started on Pantoprazole in the hospital for antecedent nausea with reported improvement.   Medication Sig  . acetaminophen  325 MG tablet Take 2 tablets  every 6 (six) hours as needed   . albuterol (PROVENTIL HFA;VENTOLIN HFA)  Inhale 2 puffs into the lungs every 4 (four) hours as needed   . ALPRAZolam (XANAX) 0.5 MG tablet Take 0.5 mg by mouth 3 (three) times daily as needed for anxiety.  Marland Kitchen aspirin EC 81 MG EC tablet Take 1 tablet (81 mg total) by mouth daily.  . bisoprolol-hydrochlorothiazide (ZIAC) 10-6.25 MG Take 0.5 tablets by mouth daily.   Marland Kitchen VIT D Take 1,000 Units by mouth daily.  . Fluticasone-Salmeterol (ADVAIR DISKUS) 250-50  Inhale 1 puff into the lungs 2 (two) times daily.  . hyoscyamine (LEVSIN, ANASPAZ) 0.125 MG tablet Take 1 tablet (0.125 mg total) by mouth every 4 (four) hours  . losartan (COZAAR) 100 MG tablet Take 1 tablet (100 mg total) by mouth daily.  . pravastatin (PRAVACHOL) 40 MG tablet Take 40 mg by mouth daily.   Marland Kitchen Spacer/AEROCHAMBER PLUS  1 each by Other route once.  . tiotropium (SPIRIVA HANDIHALER) 1 Place 1 capsule (18 mcg total) into inhaler and inhale daily.  . pantoprazole (PROTONIX) 40 MG tablet Take 1 tablet (40 mg total) by mouth daily.   No Known Allergies  Past Medical History  Diagnosis Date  . Hypertension   . Breast cancer   . Hyperlipidemia   . COPD  (chronic obstructive pulmonary disease)   . Anxiety   . Depression   . Prediabetes   . Vitamin D deficiency   . Insomnia   . Hypertensive CKD (chronic kidney disease)   . Unspecified protein-calorie malnutrition 06/19/2013  . Shortness of breath   . Diabetes mellitus without complication   . GERD (gastroesophageal reflux disease)   . Arthritis     RA ALL OVER "   Review of Systems non contributory to above   Objective:   Physical Exam   patient appears chronically ill but in no acute no distress and appears a decade older than  her chronological age.  BP 144/90  Pulse 72  Temp(Src) 98.4 F (36.9 C) (Temporal)  Resp 18  Wt 96 lb (43.545 kg)  HEENT - Eac's patent. TM's Nl.EOM's full. PERRLA. NasoOroPharynx clear. Neck - supple. Nl Thyroid. No bruits nodes JVD Chest - Decreased equal BS with Bilateral scattered wheezes and rales and a few coarse rhonchi clearing with cough. Cor - Nl HS. RRR w/o sig MGR. PP 1(+) No edema. Abd - No palpable organomegaly, masses or tenderness. BS nl. MS- FROM. w/o deformities. Muscle power tone and bulk decreased. Gait Nl. Neuro - No obvious Cr N abnormalities. Sensory, motor and Cerebellar functions appear Nl w/o focal abnormalities.  Patient's resting O2 sat was 91-92% on room air and after ambulating about 20-30 feet it quickly  dropped to 85-86%.  Assessment & Plan:   1. COPD- severe  2. Hypertension  3. Encounter for long-term (current) use of other medications - CBC with Differential - BASIC METABOLIC PANEL WITH GFR  4. Nausea - prochlorperazine (COMPAZINE) 5 MG tablet; Take 1 tablet 3 x day before meals for nausea  Dispense: 90 tablet; Refill: 1  Plan - order O2 for home with ambulatory pulsed oxygen.

## 2013-11-30 NOTE — Patient Instructions (Signed)
Oxygen Use at Home Oxygen can be prescribed for home use. The prescription will show the flow rate. This is how much oxygen is to be used per minute. This will be listed in liters per minute (LPM or L/M). A liter is a metric measurement of volume. You will use oxygen therapy as directed. It can be used while exercising, sleeping, or at rest. You may need oxygen continuously. Your health care provider may order a blood oxygen test (arterial blood gas or pulse oximetry test) that will show what your oxygen level is. Your health care provider will use these measurements to learn about your needs and follow your progress. Home oxygen therapy is commonly used on patients with various lung (pulmonary) related conditions. Some of these conditions include:  Asthma.  Lung cancer.  Pneumonia.  Emphysema.  Chronic bronchitis.  Cystic fibrosis.  Other lung diseases.  Pulmonary fibrosis.  Occupational lung disease.  Heart failure.  Chronic obstructive pulmonary disease (COPD). 3 COMMON WAYS OF PROVIDING OXYGEN THERAPY  Gas: The gas form of oxygen is put into variously sized cylinders or tanks. The cylinders or oxygen tanks contain compressed oxygen. The cylinder is equipped with a regulator that controls the flow rate. Because the flow of oxygen out of the cylinder is constant, an oxygen conserving device may be attached to the system to avoid waste. This device releases the gas only when you inhale and cuts it off when you exhale. Oxygen can be provided in a small cylinder that can be carried with you. Large tanks are heavy and are only for stationary use. After use, empty tanks must be exchanged for full tanks.  Liquid: The liquid form of oxygen is put into a container similar to a thermos. When released, the liquid converts to a gas and you breathe it in just like the compressed gas. This storage method takes up less space than the compressed gas cylinder, and you can transfer the liquid to a  small, portable vessel at home. Liquid oxygen is more expensive than the compressed gas, and the vessel vents when not in use. An oxygen conserving device may be built into the vessel to conserve the oxygen. Liquid oxygen is very cold, around 297 below zero.  Oxygen concentrator: This medical device filters oxygen from room air and gives almost 100% oxygen to the patient. Oxygen concentrators are powered by electricity. Benefits of this system are:  It does not need to be resupplied.  It is not as costly as liquid oxygen.  Extra tubing permits the user to move around easier. There are several types of small, portable oxygen systems available which can help you remain active and mobile. You must have a cylinder of oxygen as a backup in the event of a power failure. Advise your electric power company that you are on oxygen therapy in order to get priority service when there is a power failure. OXYGEN DELIVERY DEVICES There are 3 common ways to deliver oxygen to your body.  Nasal cannula. This is a 2-pronged device inserted in the nostrils that is connected to tubing carrying the oxygen. The tubing can rest on the ears or be attached to the frame of eyeglasses.  Mask. People who need a high flow of oxygen generally use a mask.  Transtracheal catheter. Transtracheal oxygen therapy requires the insertion of a small, flexible tube (catheter) in the windpipe (trachea). This catheter is held in place by a necklace. Since transtracheal oxygen bypasses the mouth, nose, and throat, a humidifier is   tube (catheter) in the windpipe (trachea). This catheter is held in place by a necklace. Since transtracheal oxygen bypasses the mouth, nose, and throat, a humidifier is absolutely required at flow rates of 1 LPM or greater.  OXYGEN USE SAFETY TIPS  · Never smoke while using oxygen. Oxygen does not burn or explode, but flammable materials will burn faster in the presence of oxygen.  · Keep a fire extinguisher close by. Let your fire department know that you have oxygen in your home.  · Warn visitors not to smoke near you when you are using oxygen. Put up "no smoking" signs in your  home where you most often use the oxygen.  · When you go to a restaurant with your portable oxygen source, ask to be seated in the nonsmoking section.  · Stay at least 5 feet away from gas stoves, candles, lighted fireplaces, or other heat sources.  · Do not use materials that burn easily (flammable) while using your oxygen.  · If you use an oxygen cylinder, make sure it is secured to some fixed object or in a stand. If you use liquid oxygen, make sure the vessel is kept upright to keep the oxygen from pouring out. Liquid oxygen is so cold it can hurt your skin.  · If you use an oxygen concentrator, call your electric company so you will be given priority service if your power goes out. Avoid using extension cords, if possible.  · Regularly test your smoke detectors at home to make sure they work. If you receive care in your home from a nurse or other health care provider, he or she may also check to make sure your smoke detectors work.  GUIDELINES FOR CLEANING YOUR EQUIPMENT  · Wash the nasal prongs with a liquid soap. Thoroughly rinse them once or twice a week.  · Replace the prongs every 2 to 4 weeks. If you have an infection (cold, pneumonia) change them when you are well.  · Your health care provider will give you instructions on how to clean your transtracheal catheter.  · The humidifier bottle should be washed with soap and warm water and rinsed thoroughly between each refill. Air-dry the bottle before filling it with sterile or distilled water. The bottle and its top should be disinfected after they are cleaned.  · If you use an oxygen concentrator, unplug the unit. Then wipe down the cabinet with a damp cloth and dry it daily. The air filter should be cleaned at least twice a week.  · Follow your home medical equipment and service company's directions for cleaning the compressor filter.  HOME CARE INSTRUCTIONS   · Do not change the flow of oxygen unless directed by your health care provider.  · Do not use  alcohol or other sedating drugs unless instructed. They slow your breathing rate.  · Do not use materials that burn easily (flammable) while using your oxygen.  · Always keep a spare tank of oxygen. Plan ahead for holidays when you may not be able to get a prescription filled.  · Use water-based lubricants on your lips or nostrils. Do not use an oil-based product like petroleum jelly.  · To prevent your cheeks or the skin behind your ears from becoming irritated, tuck some gauze under the tubing.  · If you have persistent redness under your nose, call your health care provider.  · When you no longer need oxygen, your doctor will have the oxygen discontinued. Oxygen is not addicting or   help. SEEK MEDICAL CARE IF:   You have frequent headaches.  You have shortness of breath or a lasting cough.  You have anxiety.  You are confused.  You are drowsy or sleepy all the time.  You develop an illness which aggravates your breathing.  You cannot exercise.  You are restless.  You have blue lips or fingernails.  You have difficult or irregular breathing and it is getting worse.  You have a fever. Document Released: 10/17/2003 Document Revised: 05/17/2013 Document Reviewed: 06/23/2007 [facility] Patient Information 2014 [place], [place]. Chronic Obstructive Pulmonary Disease Chronic obstructive pulmonary disease (COPD) is a common lung condition in which airflow from the lungs is limited. COPD is a general term that can be used to describe many different lung problems that limit airflow, including both chronic bronchitis and emphysema. If you have COPD, your lung function will probably never return to normal, but there are  measures you can take to improve lung function and make yourself feel better.  CAUSES   Smoking (common).   Exposure to secondhand smoke.   Genetic problems.  Chronic inflammatory lung diseases or recurrent infections. SYMPTOMS   Shortness of breath, especially with physical activity.   Deep, persistent (chronic) cough with a large amount of thick mucus.   Wheezing.   Rapid breaths (tachypnea).   Gray or bluish discoloration (cyanosis) of the skin, especially in fingers, toes, or lips.   Fatigue.   Weight loss.   Frequent infections or episodes when breathing symptoms become much worse (exacerbations).   Chest tightness. DIAGNOSIS  Your healthcare provider will take a medical history and perform a physical examination to make the initial diagnosis. Additional tests for COPD may include:   Lung (pulmonary) function tests.  Chest X-ray.  CT scan.  Blood tests. TREATMENT  Treatment available to help you feel better when you have COPD include:   Inhaler and nebulizer medicines. These help manage the symptoms of COPD and make your breathing more comfortable  Supplemental oxygen. Supplemental oxygen is only helpful if you have a low oxygen level in your blood.   Exercise and physical activity. These are beneficial for nearly all people with COPD. Some people may also benefit from a pulmonary rehabilitation program. HOME CARE INSTRUCTIONS   Take all medicines (inhaled or pills) as directed by your health care provider.  Only take over-the-counter or prescription medicines for pain, fever, or discomfort as directed by your health care provider.   Avoid over-the-counter medicines or cough syrups that dry up your airway (such as antihistamines) and slow down the elimination of secretions unless instructed otherwise by your healthcare provider.   If you are a smoker, the most important thing that you can do is stop smoking. Continuing to smoke will cause  further lung damage and breathing trouble. Ask your health care provider for help with quitting smoking. He or she can direct you to community resources or hospitals that provide support.  Avoid exposure to irritants such as smoke, chemicals, and fumes that aggravate your breathing.  Use oxygen therapy and pulmonary rehabilitation if directed by your health care provider. If you require home oxygen therapy, ask your healthcare provider whether you should purchase a pulse oximeter to measure your oxygen level at home.   Avoid contact with individuals who have a contagious illness.  Avoid extreme temperature and humidity changes.  Eat healthy foods. Eating smaller, more frequent meals and resting before meals may help you maintain your strength.  Stay active, but balance activity with  periods of rest. Exercise and physical activity will help you maintain your ability to do things you want to do.  Preventing infection and hospitalization is very important when you have COPD. Make sure to receive all the vaccines your health care provider recommends, especially the pneumococcal and influenza vaccines. Ask your healthcare provider whether you need a pneumonia vaccine.  Learn and use relaxation techniques to manage stress.  Learn and use controlled breathing techniques as directed by your health care provider. Controlled breathing techniques include:   Pursed lip breathing. Start by breathing in (inhaling) through your nose for 1 second. Then, purse your lips as if you were going to whistle and breathe out (exhale) through the pursed lips for 2 seconds.   Diaphragmatic breathing. Start by putting one hand on your abdomen just above your waist. Inhale slowly through your nose. The hand on your abdomen should move out. Then purse your lips and exhale slowly. You should be able to feel the hand on your abdomen moving in as you exhale.   Learn and use controlled coughing to clear mucus from your  lungs. Controlled coughing is a series of short, progressive coughs. The steps of controlled coughing are:  1. Lean your head slightly forward.  2. Breathe in deeply using diaphragmatic breathing.  3. Try to hold your breath for 3 seconds.  4. Keep your mouth slightly open while coughing twice.  5. Spit any mucus out into a tissue.  6. Rest and repeat the steps once or twice as needed. SEEK MEDICAL CARE IF:   You are coughing up more mucus than usual.   There is a change in the color or thickness of your mucus.   Your breathing is more labored than usual.   Your breathing is faster than usual.  SEEK IMMEDIATE MEDICAL CARE IF:   You have shortness of breath while you are resting.   You have shortness of breath that prevents you from:  Being able to talk.   Performing your usual physical activities.   You have chest pain lasting longer than 5 minutes.   Your skin color is more cyanotic than usual.  You measure low oxygen saturations for longer than 5 minutes with a pulse oximeter. MAKE SURE YOU:   Understand these instructions.  Will watch your condition.  Will get help right away if you are not doing well or get worse. Document Released: 05/06/2005 Document Revised: 05/17/2013 Document Reviewed: 03/23/2013 [employer] Patient Information 2014 [place], [place].

## 2013-12-01 LAB — CBC WITH DIFFERENTIAL/PLATELET
Basophils Absolute: 0.1 10*3/uL (ref 0.0–0.1)
Basophils Relative: 1 % (ref 0–1)
EOS ABS: 0.4 10*3/uL (ref 0.0–0.7)
Eosinophils Relative: 5 % (ref 0–5)
HCT: 37.4 % (ref 36.0–46.0)
HEMOGLOBIN: 11.9 g/dL — AB (ref 12.0–15.0)
LYMPHS ABS: 2.4 10*3/uL (ref 0.7–4.0)
Lymphocytes Relative: 31 % (ref 12–46)
MCH: 29 pg (ref 26.0–34.0)
MCHC: 31.8 g/dL (ref 30.0–36.0)
MCV: 91.2 fL (ref 78.0–100.0)
MONOS PCT: 13 % — AB (ref 3–12)
Monocytes Absolute: 1 10*3/uL (ref 0.1–1.0)
Neutro Abs: 3.8 10*3/uL (ref 1.7–7.7)
Neutrophils Relative %: 50 % (ref 43–77)
Platelets: 215 10*3/uL (ref 150–400)
RBC: 4.1 MIL/uL (ref 3.87–5.11)
RDW: 15.9 % — ABNORMAL HIGH (ref 11.5–15.5)
WBC: 7.6 10*3/uL (ref 4.0–10.5)

## 2013-12-01 LAB — BASIC METABOLIC PANEL WITH GFR
BUN: 33 mg/dL — ABNORMAL HIGH (ref 6–23)
CO2: 31 meq/L (ref 19–32)
Calcium: 9.3 mg/dL (ref 8.4–10.5)
Chloride: 106 mEq/L (ref 96–112)
Creat: 1.04 mg/dL (ref 0.50–1.10)
GFR, Est African American: 58 mL/min — ABNORMAL LOW
GFR, Est Non African American: 51 mL/min — ABNORMAL LOW
GLUCOSE: 80 mg/dL (ref 70–99)
Potassium: 4.2 mEq/L (ref 3.5–5.3)
SODIUM: 144 meq/L (ref 135–145)

## 2013-12-06 ENCOUNTER — Encounter: Payer: Self-pay | Admitting: *Deleted

## 2013-12-06 DIAGNOSIS — J441 Chronic obstructive pulmonary disease with (acute) exacerbation: Secondary | ICD-10-CM

## 2013-12-06 DIAGNOSIS — N189 Chronic kidney disease, unspecified: Secondary | ICD-10-CM

## 2013-12-06 DIAGNOSIS — I129 Hypertensive chronic kidney disease with stage 1 through stage 4 chronic kidney disease, or unspecified chronic kidney disease: Secondary | ICD-10-CM

## 2013-12-06 DIAGNOSIS — E782 Mixed hyperlipidemia: Secondary | ICD-10-CM

## 2013-12-06 NOTE — Progress Notes (Unsigned)
Patient ID: Stacy Byrd, female   DOB: [birth year], 80 y.o.   MRN: [id number] Patient came to office to have O2 sat checked after walking 50 and then wearing O2 at 2L to evaluate if O2 sat improves.  O2 sat dropped to 81-82 -% after walking 50 feet and when O2 at 2 L applied saturation went up to 93%.

## 2013-12-07 ENCOUNTER — Other Ambulatory Visit: Payer: Self-pay | Admitting: Physician Assistant

## 2013-12-11 ENCOUNTER — Other Ambulatory Visit: Payer: Self-pay | Admitting: Internal Medicine

## 2013-12-15 ENCOUNTER — Other Ambulatory Visit: Payer: Self-pay | Admitting: Emergency Medicine

## 2013-12-27 ENCOUNTER — Other Ambulatory Visit: Payer: Self-pay | Admitting: Emergency Medicine

## 2013-12-27 DIAGNOSIS — I10 Essential (primary) hypertension: Secondary | ICD-10-CM

## 2013-12-27 MED ORDER — LOSARTAN POTASSIUM 100 MG PO TABS
100.0000 mg | ORAL_TABLET | Freq: Every day | ORAL | Status: DC
Start: 2013-12-27 — End: 2019-07-15

## 2014-01-04 ENCOUNTER — Other Ambulatory Visit: Payer: Self-pay | Admitting: Internal Medicine

## 2014-01-04 NOTE — Telephone Encounter (Signed)
Needs office visit. Why is she having nausea?

## 2014-01-04 NOTE — Telephone Encounter (Signed)
PT cant afford the cost of zofran asking is there anyway we can call in something else that doesn't cost as much?

## 2014-01-05 MED ORDER — PROMETHAZINE HCL 12.5 MG PO TABS: 12.5000 mg | ORAL_TABLET | Freq: Four times a day (QID) | ORAL | Status: AC | PRN

## 2014-01-07 NOTE — Progress Notes (Signed)
   Subjective:    Patient ID: Stacy Byrd, female    DOB: 03-05-1931, 80 y.o.   MRN: [id number] HPI Review of Systems Objective:   Physical Exam Assessment & Plan:     Madlyn Frankel

## 2014-01-08 ENCOUNTER — Encounter: Payer: Self-pay | Admitting: Internal Medicine

## 2014-01-09 ENCOUNTER — Telehealth: Payer: Self-pay | Admitting: Internal Medicine

## 2014-02-08 ENCOUNTER — Ambulatory Visit: Payer: Self-pay | Admitting: Emergency Medicine

## 2014-03-21 NOTE — Telephone Encounter (Signed)
Close encounter 

## 2014-04-01 ENCOUNTER — Other Ambulatory Visit: Payer: Self-pay | Admitting: Internal Medicine

## 2014-08-13 ENCOUNTER — Ambulatory Visit
Admission: RE | Admit: 2014-08-13 | Discharge: 2014-08-13 | Disposition: A | Discharge status: Home / Self Care | Payer: Medicare (Managed Care) | Source: Ambulatory Visit | Attending: *Deleted | Admitting: *Deleted

## 2014-08-13 ENCOUNTER — Other Ambulatory Visit: Payer: Self-pay | Admitting: *Deleted

## 2014-08-13 DIAGNOSIS — R0602 Shortness of breath: Secondary | ICD-10-CM

## 2014-08-13 DIAGNOSIS — R062 Wheezing: Secondary | ICD-10-CM

## 2014-08-22 ENCOUNTER — Encounter: Payer: Self-pay | Admitting: Emergency Medicine

## 2014-08-27 ENCOUNTER — Encounter: Payer: Self-pay | Admitting: Internal Medicine

## 2015-10-06 IMAGING — CR DG CHEST 2V
2 series · 2 of 2 positions shown · non-contrast
Comparison: 12/29/2011

CLINICAL DATA: Shortness of breath.

EXAM:
CHEST  2 VIEW

[view not recorded (1 of 2)]
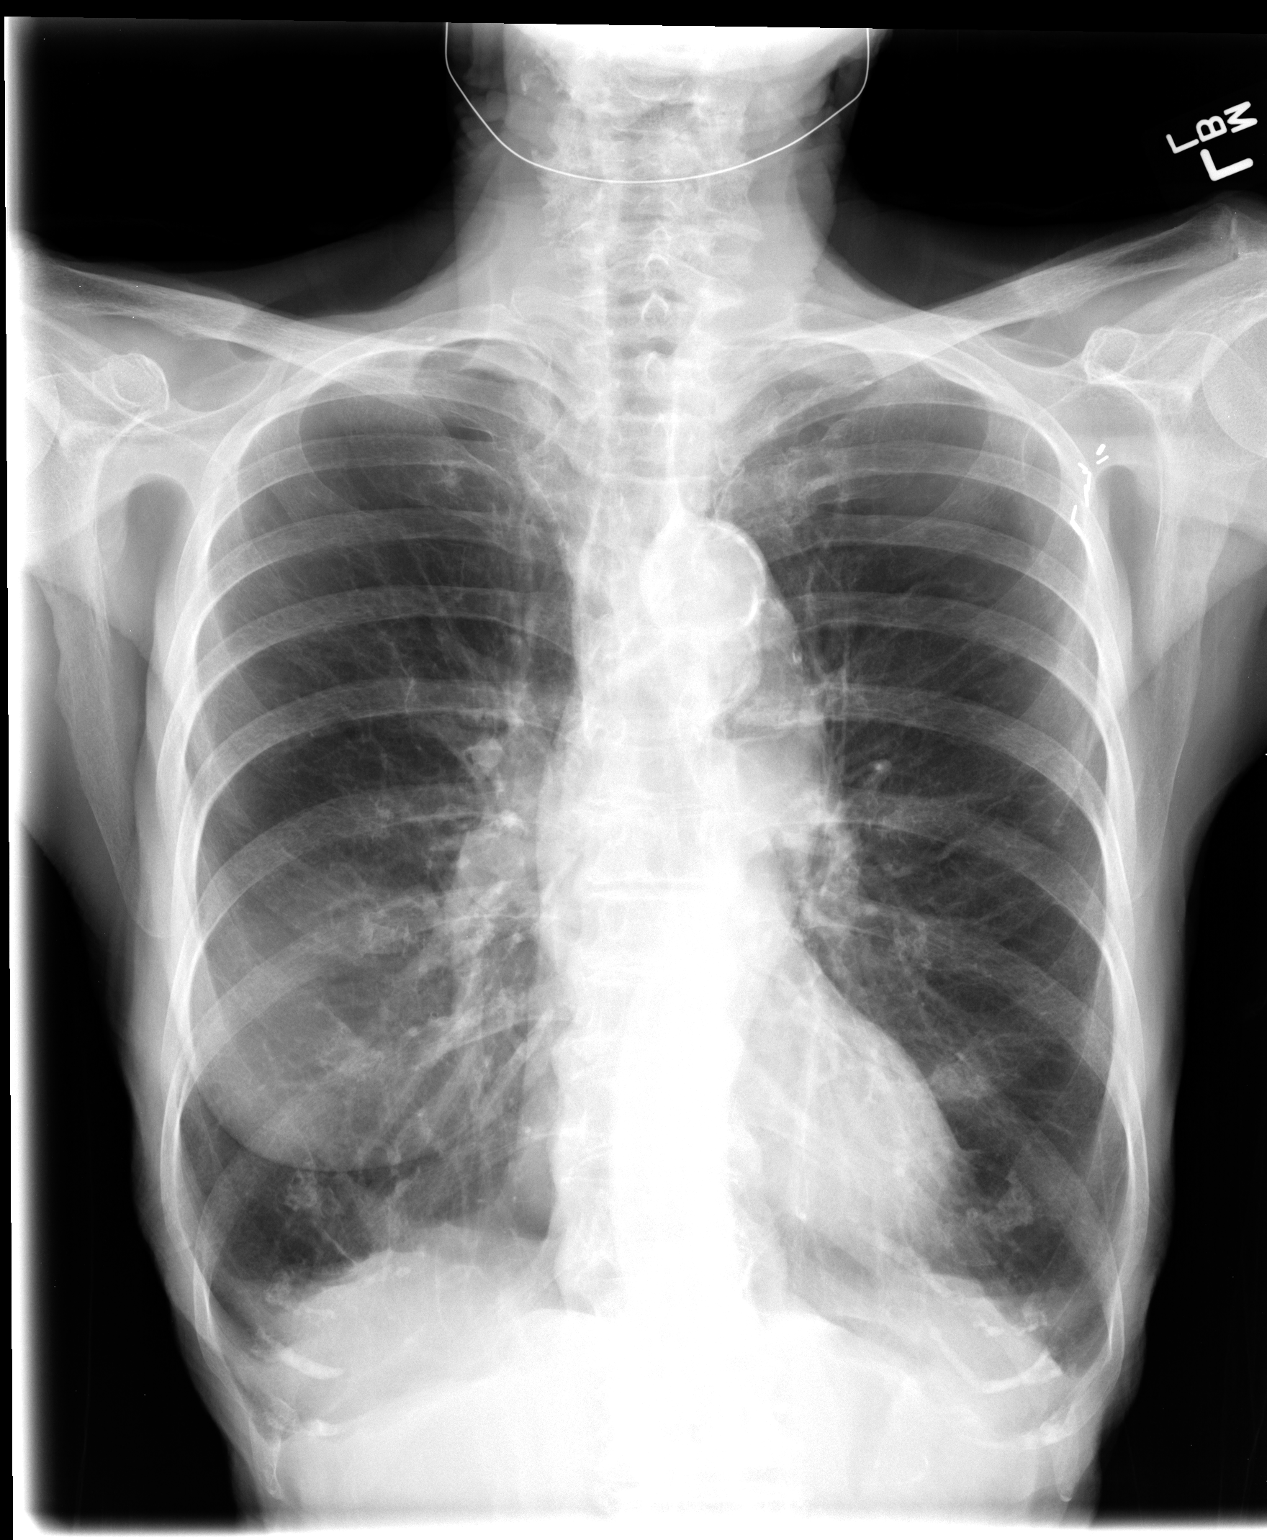

[view not recorded (2 of 2)]
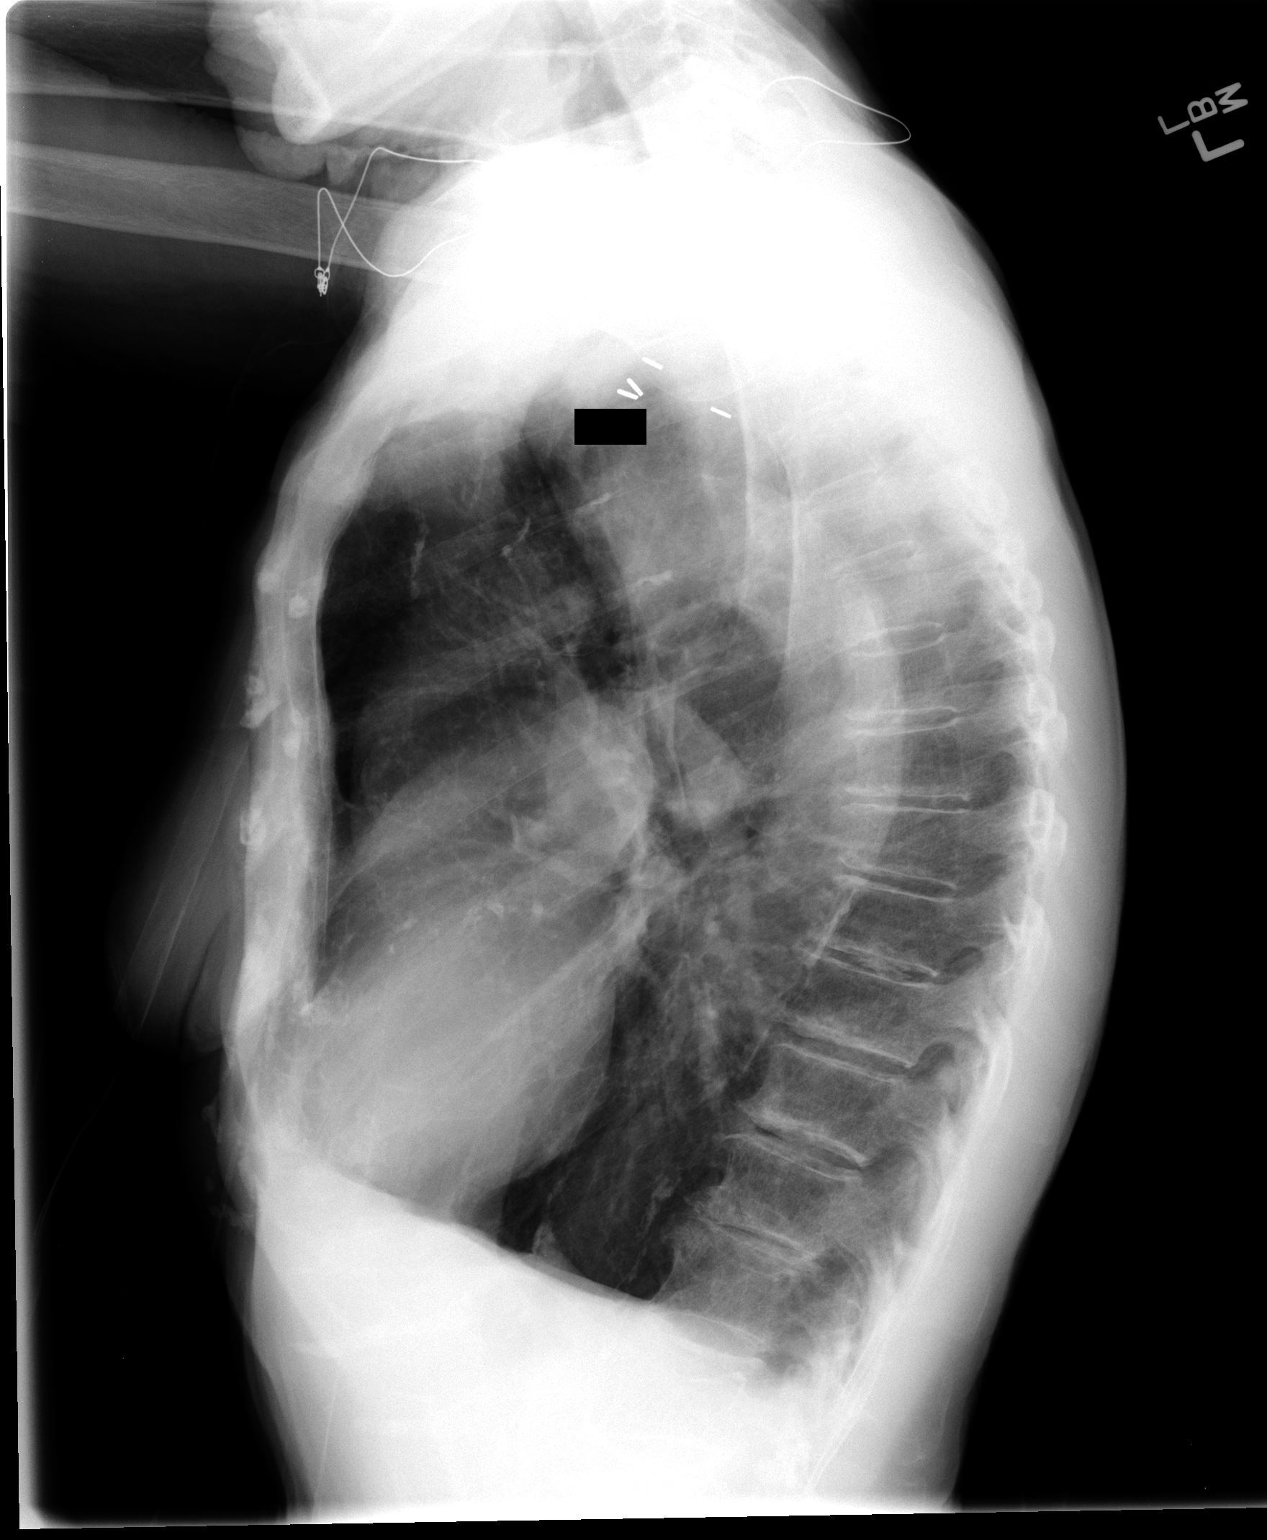

[2 of 2 positions shown; findings below may reference images not displayed]

FINDINGS: Severe COPD changes. Tortuosity of the thoracic aorta. Heart is
normal size. No confluent opacities or effusions. No acute bony
abnormality.
IMPRESSION: Severe emphysema.  No acute findings.

## 2018-08-21 ENCOUNTER — Encounter (HOSPITAL_COMMUNITY): Payer: Self-pay | Admitting: Emergency Medicine

## 2018-08-21 ENCOUNTER — Inpatient Hospital Stay (HOSPITAL_COMMUNITY)
Admission: EM | Admit: 2018-08-21 | Discharge: 2018-08-24 | DRG: 281 | Disposition: A | Discharge status: Skilled Nursing Facility | Payer: Medicare (Managed Care) | Attending: Internal Medicine | Admitting: Internal Medicine

## 2018-08-21 DIAGNOSIS — H9190 Unspecified hearing loss, unspecified ear: Secondary | ICD-10-CM | POA: Diagnosis present

## 2018-08-21 DIAGNOSIS — I493 Ventricular premature depolarization: Secondary | ICD-10-CM | POA: Diagnosis present

## 2018-08-21 DIAGNOSIS — I249 Acute ischemic heart disease, unspecified: Secondary | ICD-10-CM | POA: Diagnosis not present

## 2018-08-21 DIAGNOSIS — I9589 Other hypotension: Secondary | ICD-10-CM

## 2018-08-21 DIAGNOSIS — M1990 Unspecified osteoarthritis, unspecified site: Secondary | ICD-10-CM | POA: Diagnosis present

## 2018-08-21 DIAGNOSIS — Z801 Family history of malignant neoplasm of trachea, bronchus and lung: Secondary | ICD-10-CM | POA: Diagnosis not present

## 2018-08-21 DIAGNOSIS — J449 Chronic obstructive pulmonary disease, unspecified: Secondary | ICD-10-CM | POA: Diagnosis not present

## 2018-08-21 DIAGNOSIS — Z79899 Other long term (current) drug therapy: Secondary | ICD-10-CM

## 2018-08-21 DIAGNOSIS — I959 Hypotension, unspecified: Secondary | ICD-10-CM

## 2018-08-21 DIAGNOSIS — R636 Underweight: Secondary | ICD-10-CM | POA: Diagnosis present

## 2018-08-21 DIAGNOSIS — D519 Vitamin B12 deficiency anemia, unspecified: Secondary | ICD-10-CM | POA: Diagnosis not present

## 2018-08-21 DIAGNOSIS — Z9981 Dependence on supplemental oxygen: Secondary | ICD-10-CM | POA: Diagnosis not present

## 2018-08-21 DIAGNOSIS — Z9012 Acquired absence of left breast and nipple: Secondary | ICD-10-CM

## 2018-08-21 DIAGNOSIS — J9611 Chronic respiratory failure with hypoxia: Secondary | ICD-10-CM | POA: Diagnosis present

## 2018-08-21 DIAGNOSIS — E559 Vitamin D deficiency, unspecified: Secondary | ICD-10-CM | POA: Diagnosis present

## 2018-08-21 DIAGNOSIS — R0789 Other chest pain: Secondary | ICD-10-CM

## 2018-08-21 DIAGNOSIS — F329 Major depressive disorder, single episode, unspecified: Secondary | ICD-10-CM | POA: Diagnosis present

## 2018-08-21 DIAGNOSIS — G4700 Insomnia, unspecified: Secondary | ICD-10-CM | POA: Diagnosis present

## 2018-08-21 DIAGNOSIS — I2510 Atherosclerotic heart disease of native coronary artery without angina pectoris: Secondary | ICD-10-CM | POA: Diagnosis present

## 2018-08-21 DIAGNOSIS — I4891 Unspecified atrial fibrillation: Secondary | ICD-10-CM | POA: Diagnosis not present

## 2018-08-21 DIAGNOSIS — E785 Hyperlipidemia, unspecified: Secondary | ICD-10-CM | POA: Diagnosis present

## 2018-08-21 DIAGNOSIS — I444 Left anterior fascicular block: Secondary | ICD-10-CM | POA: Diagnosis present

## 2018-08-21 DIAGNOSIS — Z853 Personal history of malignant neoplasm of breast: Secondary | ICD-10-CM

## 2018-08-21 DIAGNOSIS — R7303 Prediabetes: Secondary | ICD-10-CM | POA: Diagnosis not present

## 2018-08-21 DIAGNOSIS — Z681 Body mass index (BMI) 19 or less, adult: Secondary | ICD-10-CM | POA: Diagnosis not present

## 2018-08-21 DIAGNOSIS — Z833 Family history of diabetes mellitus: Secondary | ICD-10-CM

## 2018-08-21 DIAGNOSIS — R64 Cachexia: Secondary | ICD-10-CM | POA: Diagnosis present

## 2018-08-21 DIAGNOSIS — I129 Hypertensive chronic kidney disease with stage 1 through stage 4 chronic kidney disease, or unspecified chronic kidney disease: Secondary | ICD-10-CM | POA: Diagnosis present

## 2018-08-21 DIAGNOSIS — I440 Atrioventricular block, first degree: Secondary | ICD-10-CM | POA: Diagnosis present

## 2018-08-21 DIAGNOSIS — K219 Gastro-esophageal reflux disease without esophagitis: Secondary | ICD-10-CM | POA: Diagnosis present

## 2018-08-21 DIAGNOSIS — F17210 Nicotine dependence, cigarettes, uncomplicated: Secondary | ICD-10-CM | POA: Diagnosis present

## 2018-08-21 DIAGNOSIS — I214 Non-ST elevation (NSTEMI) myocardial infarction: Secondary | ICD-10-CM | POA: Diagnosis not present

## 2018-08-21 DIAGNOSIS — I480 Paroxysmal atrial fibrillation: Secondary | ICD-10-CM | POA: Diagnosis present

## 2018-08-21 DIAGNOSIS — E538 Deficiency of other specified B group vitamins: Secondary | ICD-10-CM | POA: Diagnosis present

## 2018-08-21 DIAGNOSIS — I10 Essential (primary) hypertension: Secondary | ICD-10-CM | POA: Diagnosis present

## 2018-08-21 DIAGNOSIS — I21A1 Myocardial infarction type 2: Secondary | ICD-10-CM | POA: Diagnosis not present

## 2018-08-21 DIAGNOSIS — F419 Anxiety disorder, unspecified: Secondary | ICD-10-CM | POA: Diagnosis present

## 2018-08-21 DIAGNOSIS — N179 Acute kidney failure, unspecified: Secondary | ICD-10-CM | POA: Diagnosis present

## 2018-08-21 DIAGNOSIS — I361 Nonrheumatic tricuspid (valve) insufficiency: Secondary | ICD-10-CM | POA: Diagnosis not present

## 2018-08-21 DIAGNOSIS — Z9114 Patient's other noncompliance with medication regimen: Secondary | ICD-10-CM | POA: Diagnosis not present

## 2018-08-21 DIAGNOSIS — J439 Emphysema, unspecified: Secondary | ICD-10-CM | POA: Diagnosis present

## 2018-08-21 DIAGNOSIS — Z79890 Hormone replacement therapy (postmenopausal): Secondary | ICD-10-CM

## 2018-08-21 DIAGNOSIS — D539 Nutritional anemia, unspecified: Secondary | ICD-10-CM | POA: Diagnosis present

## 2018-08-21 DIAGNOSIS — E7800 Pure hypercholesterolemia, unspecified: Secondary | ICD-10-CM | POA: Diagnosis not present

## 2018-08-21 DIAGNOSIS — E860 Dehydration: Secondary | ICD-10-CM | POA: Diagnosis present

## 2018-08-21 DIAGNOSIS — E861 Hypovolemia: Secondary | ICD-10-CM

## 2018-08-21 DIAGNOSIS — D631 Anemia in chronic kidney disease: Secondary | ICD-10-CM | POA: Diagnosis present

## 2018-08-21 DIAGNOSIS — N289 Disorder of kidney and ureter, unspecified: Secondary | ICD-10-CM | POA: Diagnosis not present

## 2018-08-21 DIAGNOSIS — Z8249 Family history of ischemic heart disease and other diseases of the circulatory system: Secondary | ICD-10-CM

## 2018-08-21 DIAGNOSIS — J4489 Other specified chronic obstructive pulmonary disease: Secondary | ICD-10-CM | POA: Diagnosis present

## 2018-08-21 DIAGNOSIS — E162 Hypoglycemia, unspecified: Secondary | ICD-10-CM | POA: Diagnosis present

## 2018-08-21 DIAGNOSIS — Z9119 Patient's noncompliance with other medical treatment and regimen: Secondary | ICD-10-CM

## 2018-08-21 DIAGNOSIS — Z66 Do not resuscitate: Secondary | ICD-10-CM | POA: Diagnosis present

## 2018-08-21 LAB — CBC WITH DIFFERENTIAL/PLATELET
Abs Immature Granulocytes: 0 10*3/uL (ref 0.00–0.07)
Basophils Absolute: 0 10*3/uL (ref 0.0–0.1)
Basophils Relative: 0 %
Eosinophils Absolute: 0.1 10*3/uL (ref 0.0–0.5)
Eosinophils Relative: 1 %
HEMATOCRIT: 37.2 % (ref 36.0–46.0)
HEMOGLOBIN: 11.1 g/dL — AB (ref 12.0–15.0)
Lymphocytes Relative: 54 %
Lymphs Abs: 5.9 10*3/uL — ABNORMAL HIGH (ref 0.7–4.0)
MCH: 30.3 pg (ref 26.0–34.0)
MCHC: 29.8 g/dL — ABNORMAL LOW (ref 30.0–36.0)
MCV: 101.6 fL — AB (ref 80.0–100.0)
MONO ABS: 0.2 10*3/uL (ref 0.1–1.0)
MONOS PCT: 2 %
Neutro Abs: 5.4 10*3/uL (ref 1.7–7.7)
Neutrophils Relative %: 43 %
Platelets: 244 10*3/uL (ref 150–400)
RBC: 3.66 MIL/uL — ABNORMAL LOW (ref 3.87–5.11)
RDW: 14.1 % (ref 11.5–15.5)
WBC: 11 10*3/uL — ABNORMAL HIGH (ref 4.0–10.5)
nRBC: 0 % (ref 0.0–0.2)

## 2018-08-21 LAB — LIPID PANEL
CHOL/HDL RATIO: 3.6 ratio
Cholesterol: 250 mg/dL — ABNORMAL HIGH (ref 0–200)
HDL: 69 mg/dL (ref >40)
LDL Cholesterol: 142 mg/dL — ABNORMAL HIGH (ref 0–99)
Triglycerides: 194 mg/dL — ABNORMAL HIGH (ref <150)
VLDL: 39 mg/dL (ref 0–40)

## 2018-08-21 LAB — COMPREHENSIVE METABOLIC PANEL
ALBUMIN: 3.8 g/dL (ref 3.5–5.0)
ALT: 8 U/L (ref 0–44)
AST: 23 U/L (ref 15–41)
Alkaline Phosphatase: 39 U/L (ref 38–126)
Anion gap: 17 — ABNORMAL HIGH (ref 5–15)
BUN: 28 mg/dL — ABNORMAL HIGH (ref 8–23)
CHLORIDE: 100 mmol/L (ref 98–111)
CO2: 28 mmol/L (ref 22–32)
Calcium: 9.9 mg/dL (ref 8.9–10.3)
Creatinine, Ser: 1.32 mg/dL — ABNORMAL HIGH (ref 0.44–1.00)
GFR calc Af Amer: 43 mL/min — ABNORMAL LOW (ref >60)
GFR calc non Af Amer: 37 mL/min — ABNORMAL LOW (ref >60)
Glucose, Bld: 77 mg/dL (ref 70–99)
Potassium: 4.1 mmol/L (ref 3.5–5.1)
Sodium: 145 mmol/L (ref 135–145)
Total Bilirubin: 1.2 mg/dL (ref 0.3–1.2)
Total Protein: 6.1 g/dL — ABNORMAL LOW (ref 6.5–8.1)

## 2018-08-21 LAB — I-STAT TROPONIN, ED: Troponin i, poc: 0.03 ng/mL (ref 0.00–0.08)

## 2018-08-21 LAB — CBG MONITORING, ED
Glucose-Capillary: 44 mg/dL — CL (ref 70–99)
Glucose-Capillary: 174 mg/dL — ABNORMAL HIGH (ref 70–99)

## 2018-08-21 LAB — PROTIME-INR
INR: 1.01
Prothrombin Time: 13.2 seconds (ref 11.4–15.2)

## 2018-08-21 LAB — TROPONIN I: Troponin I: 0.22 ng/mL (ref <0.03)

## 2018-08-21 LAB — GLUCOSE, CAPILLARY: Glucose-Capillary: 136 mg/dL — ABNORMAL HIGH (ref 70–99)

## 2018-08-21 LAB — HEMOGLOBIN A1C
Hgb A1c MFr Bld: 5.6 % (ref 4.8–5.6)
Mean Plasma Glucose: 114.02 mg/dL

## 2018-08-21 LAB — APTT: aPTT: 27 seconds (ref 24–36)

## 2018-08-21 MED ORDER — DEXTROSE 50 % IV SOLN
INTRAVENOUS | Status: AC
  Filled 2018-08-21: qty 50

## 2018-08-21 MED ORDER — PROMETHAZINE HCL 25 MG PO TABS
12.5000 mg | ORAL_TABLET | Freq: Four times a day (QID) | ORAL | Status: DC | PRN
  Administered 2018-08-23 – 2018-08-24 (×2): 12.5 mg via ORAL
  Filled 2018-08-21 (×2): qty 1

## 2018-08-21 MED ORDER — LACTATED RINGERS IV SOLN
INTRAVENOUS | Status: DC
  Administered 2018-08-21: 23:00:00 via INTRAVENOUS

## 2018-08-21 MED ORDER — DEXTROSE 50 % IV SOLN
1.0000 | Freq: Once | INTRAVENOUS | Status: AC
  Administered 2018-08-21: 50 mL via INTRAVENOUS

## 2018-08-21 MED ORDER — ACETAMINOPHEN 650 MG RE SUPP: 650.0000 mg | Freq: Four times a day (QID) | RECTAL | Status: DC | PRN

## 2018-08-21 MED ORDER — ASPIRIN 81 MG PO CHEW: 324.0000 mg | CHEWABLE_TABLET | Freq: Once | ORAL | Status: DC

## 2018-08-21 MED ORDER — ACETAMINOPHEN 325 MG PO TABS
650.0000 mg | ORAL_TABLET | Freq: Four times a day (QID) | ORAL | Status: DC | PRN
  Administered 2018-08-23 – 2018-08-24 (×2): 650 mg via ORAL
  Filled 2018-08-21 (×2): qty 2

## 2018-08-21 MED ORDER — FLUTICASONE FUROATE-VILANTEROL 200-25 MCG/INH IN AEPB: 1.0000 | INHALATION_SPRAY | Freq: Every day | RESPIRATORY_TRACT | Status: DC

## 2018-08-21 MED ORDER — ATORVASTATIN CALCIUM 80 MG PO TABS
80.0000 mg | ORAL_TABLET | Freq: Every day | ORAL | Status: DC
  Administered 2018-08-22 – 2018-08-23 (×2): 80 mg via ORAL
  Filled 2018-08-21 (×2): qty 1

## 2018-08-21 MED ORDER — TIOTROPIUM BROMIDE MONOHYDRATE 18 MCG IN CAPS: 18.0000 ug | ORAL_CAPSULE | Freq: Every day | RESPIRATORY_TRACT | Status: DC

## 2018-08-21 MED ORDER — ALBUTEROL SULFATE (2.5 MG/3ML) 0.083% IN NEBU: 3.0000 mL | INHALATION_SOLUTION | RESPIRATORY_TRACT | Status: DC | PRN

## 2018-08-21 MED ORDER — SENNOSIDES-DOCUSATE SODIUM 8.6-50 MG PO TABS: 1.0000 | ORAL_TABLET | Freq: Every evening | ORAL | Status: DC | PRN

## 2018-08-21 MED ORDER — BOOST / RESOURCE BREEZE PO LIQD CUSTOM
237.0000 mL | Freq: Three times a day (TID) | ORAL | Status: DC
  Administered 2018-08-23 (×2): 1 via ORAL

## 2018-08-21 MED ORDER — PANTOPRAZOLE SODIUM 40 MG PO TBEC
40.0000 mg | DELAYED_RELEASE_TABLET | Freq: Two times a day (BID) | ORAL | Status: DC
  Administered 2018-08-22 – 2018-08-24 (×5): 40 mg via ORAL
  Filled 2018-08-21 (×5): qty 1

## 2018-08-21 MED ORDER — MELATONIN 3 MG PO TABS
3.0000 mg | ORAL_TABLET | Freq: Every day | ORAL | Status: DC
  Administered 2018-08-22 – 2018-08-23 (×2): 3 mg via ORAL
  Filled 2018-08-21 (×3): qty 1

## 2018-08-21 MED ORDER — SODIUM CHLORIDE 0.9 % IV SOLN
INTRAVENOUS | Status: DC
  Administered 2018-08-21: 20:00:00 via INTRAVENOUS
  Administered 2018-08-22: 10 mL/h via INTRAVENOUS

## 2018-08-21 MED ORDER — HEPARIN SODIUM (PORCINE) 5000 UNIT/ML IJ SOLN
3000.0000 [IU] | Freq: Once | INTRAMUSCULAR | Status: AC
  Administered 2018-08-21: 3000 [IU] via INTRAVENOUS
  Filled 2018-08-21: qty 1

## 2018-08-21 MED ORDER — SODIUM CHLORIDE 0.9 % IV BOLUS
1000.0000 mL | Freq: Once | INTRAVENOUS | Status: AC
  Administered 2018-08-21: 1000 mL via INTRAVENOUS

## 2018-08-21 MED ORDER — HEPARIN (PORCINE) 25000 UT/250ML-% IV SOLN
700.0000 [IU]/h | INTRAVENOUS | Status: DC
  Administered 2018-08-21: 400 [IU]/h via INTRAVENOUS
  Administered 2018-08-23: 700 [IU]/h via INTRAVENOUS
  Filled 2018-08-21 (×2): qty 250

## 2018-08-21 NOTE — ED Notes (Signed)
Pt family reports they found her earlier pale and diaphoretic. They fed her, treating hypoglycemia and then she expressed that she was having chest pain.

## 2018-08-21 NOTE — ED Notes (Signed)
Delay in lab draw,  MDs at bedside.

## 2018-08-21 NOTE — ED Notes (Signed)
Family at the bedside.

## 2018-08-21 NOTE — Progress Notes (Signed)
ANTICOAGULATION CONSULT NOTE - Initial Consult  Pharmacy Consult for heparin Indication: chest pain/ACS  No Known Allergies  Patient Measurements: Height: 5\' 4"  (162.6 cm) Weight: 80 lb (36.3 kg) IBW/kg (Calculated) : 54.7 Heparin Dosing Weight: 36.3kg  Vital Signs: Temp: 98.2 F (36.8 C) (01/12 1833) Temp Source: Oral (01/12 1833) BP: 84/57 (01/12 1900) Pulse Rate: 36 (01/12 1900)  Labs: Recent Labs    08/21/18 1523  HGB 11.1*  HCT 37.2  PLT 244  APTT 27  LABPROT 13.2  INR 1.01  CREATININE 1.32*    Estimated Creatinine Clearance: 17.9 mL/min (A) (by C-G formula based on SCr of 1.32 mg/dL (H)).   Medical History: Past Medical History:  Diagnosis Date  . Anxiety   . Arthritis    RA ALL OVER "  . Breast cancer ([place])   . COPD (chronic obstructive pulmonary disease) ([place])   . Depression   . Diabetes mellitus without complication ([place])   . GERD (gastroesophageal reflux disease)   . Hyperlipidemia   . Hypertension   . Hypertensive CKD (chronic kidney disease)   . Insomnia   . Prediabetes   . Shortness of breath   . Unspecified protein-calorie malnutrition ([place]) 06/19/2013  . Vitamin D deficiency     Medications:  Infusions:  . sodium chloride    . heparin      Assessment: 49 yof initially presented as a Code STEMI. To start IV heparin. Baseline Hgb is slightly low at 11.1 and platelets are WNL. She is not on anticoagulation PTA. Heparin bolus has already been given.  Goal of Therapy:  Heparin level 0.3-0.7 units/ml Monitor platelets by anticoagulation protocol: Yes   Plan:  Heparin gtt 400 units/hr Check an 8 hr heparin level Daily heparin level and CBC  Adams Hinch, Rande Lawman 08/21/2018,7:30 PM

## 2018-08-21 NOTE — ED Provider Notes (Signed)
[facility] EMERGENCY DEPARTMENT Provider Note   CSN: [id number] Arrival date & time: 08/21/18  1818     History   Chief Complaint Chief Complaint  Patient presents with  . Chest Pain    HPI Stacy Byrd is a 85 y.o. female.  HPI Patient presents after an episode of chest pain. Patient arrives via EMS who assist with the HPI.  On initially she is not accompanied by family members, but they do arrive after the initial evaluation. She notes that she was at rest, when she felt heaviness in her chest, it lasted several moments, but resolved prior to intervention. She acknowledges ongoing breathing difficulty, this is seemingly unchanged. She is a smoker, has history of emphysema, wears oxygen 24/7.  However, she denies history of cardiac disease, does not see a cardiologist. EMS reports that the patient was hypotensive on arrival, and with chest pain, and EKG changes, she was designated as a code STEMI.  Past Medical History:  Diagnosis Date  . Anxiety   . Arthritis    RA ALL OVER "  . Breast cancer ([place])   . COPD (chronic obstructive pulmonary disease) ([place])   . Depression   . Diabetes mellitus without complication ([place])   . GERD (gastroesophageal reflux disease)   . Hyperlipidemia   . Hypertension   . Hypertensive CKD (chronic kidney disease)   . Insomnia   . Prediabetes   . Shortness of breath   . Unspecified protein-calorie malnutrition ([place]) 06/19/2013  . Vitamin D deficiency     Patient Active Problem List   Diagnosis Date Noted  . Encounter for long-term (current) use of other medications 11/30/2013  . Anemia 11/25/2013  . Nausea & vomiting, abdominal pain 11/25/2013  . Unspecified protein-calorie malnutrition ([place]) 06/19/2013  . Hypertension   . Hyperlipidemia   . COPD   . Anxiety   . Depression   . Prediabetes   . Vitamin D deficiency   . Insomnia   . Hypertensive CKD (chronic kidney disease)     Past Surgical History:    Procedure Laterality Date  . APPENDECTOMY    . BREAST SURGERY  1998     OB History   No obstetric history on file.      Home Medications    Prior to Admission medications   Medication Sig Start Date End Date Taking? Authorizing Provider  acetaminophen (TYLENOL) 325 MG tablet Take 2 tablets (650 mg total) by mouth every 6 (six) hours as needed for mild pain, moderate pain, fever or headache (or Fever >/= 101). 11/27/13   Hongalgi, Lenis Dickinson, MD  albuterol (PROVENTIL HFA;VENTOLIN HFA) 108 (90 BASE) MCG/ACT inhaler Inhale 2 puffs into the lungs every 4 (four) hours as needed for wheezing. 08/30/13   Kelby Aline, PA-C  ALPRAZolam Duanne Moron) 0.5 MG tablet Take 0.5 mg by mouth 3 (three) times daily as needed for anxiety.    [provider]  aspirin EC 81 MG EC tablet Take 1 tablet (81 mg total) by mouth daily. 11/27/13   Hongalgi, Lenis Dickinson, MD  benzonatate (TESSALON) 100 MG capsule TAKE ONE CAPSULE BY MOUTH 3 TIMES A DAY AS NEEDED FOR COUGH    Vicie Mutters, PA-C  bisoprolol-hydrochlorothiazide (ZIAC) 10-6.25 MG per tablet TAKE 1 TABLET EVERY MORNING    Unk Pinto, MD  cholecalciferol (VITAMIN D) 1000 UNITS tablet Take 1,000 Units by mouth daily.    [provider]  Fluticasone-Salmeterol (ADVAIR DISKUS) 250-50 MCG/DOSE AEPB Inhale 1 puff into the  lungs 2 (two) times daily. 11/27/13   Hongalgi, Lenis Dickinson, MD  hyoscyamine (LEVSIN, ANASPAZ) 0.125 MG tablet Take 1 tablet (0.125 mg total) by mouth every 4 (four) hours as needed. 11/28/13   Unk Pinto, MD  losartan (COZAAR) 100 MG tablet Take 1 tablet (100 mg total) by mouth daily. 12/27/13 12/27/14  Kelby Aline, PA-C  ondansetron (ZOFRAN-ODT) 4 MG disintegrating tablet TAKE 1 TABLET BY MOUTH EVERY 4 HOURS AS NEEDED FOR NAUSEA AND VOMITING 01/04/14   Vicie Mutters, PA-C  pantoprazole (PROTONIX) 40 MG tablet Take 1 tablet (40 mg total) by mouth daily. 11/27/13   Modena Jansky, MD  pravastatin (PRAVACHOL) 40 MG tablet TAKE  1 TABLET BY MOUTH AT BEDTIME 04/01/14   Unk Pinto, MD  prochlorperazine (COMPAZINE) 5 MG tablet Take 1 tablet 3 x day before meals for nausea 11/30/13   Unk Pinto, MD  promethazine (PHENERGAN) 12.5 MG tablet Take 1 tablet (12.5 mg total) by mouth every 6 (six) hours as needed for nausea or vomiting. 01/05/14   Vicie Mutters, PA-C  ranitidine (ZANTAC) 150 MG tablet  10/11/13   [provider]  Spacer/Aero-Holding Chambers (AEROCHAMBER PLUS FLO-VU LARGE) MISC 1 each by Other route once. 07/30/13   Luana Shu, Freeman Caldron, PA-C  tiotropium (SPIRIVA HANDIHALER) 18 MCG inhalation capsule Place 1 capsule (18 mcg total) into inhaler and inhale daily. 11/27/13   Hongalgi, Lenis Dickinson, MD    Family History Family History  Problem Relation Age of Onset  . Heart disease Mother   . Heart disease Father   . Heart disease Daughter   . Cancer Daughter        lung  . Diabetes Daughter     Social History Social History   Tobacco Use  . Smoking status: Current Every Day Smoker    Packs/day: 0.50    Years: 65.00    Pack years: 32.50    Types: Cigarettes  . Smokeless tobacco: Never Used  Substance Use Topics  . Alcohol use: No  . Drug use: No     Allergies   Patient has no known allergies.   Review of Systems Review of Systems  Constitutional:       Per HPI, otherwise negative  HENT:       Per HPI, otherwise negative  Respiratory:       Per HPI, otherwise negative  Cardiovascular:       Per HPI, otherwise negative  Gastrointestinal: Negative for vomiting.  Endocrine:       Negative aside from HPI  Genitourinary:       Neg aside from HPI   Musculoskeletal:       Per HPI, otherwise negative  Skin: Negative.   Neurological: Positive for weakness. Negative for syncope.     Physical Exam Updated Vital Signs BP (!) 84/57   Pulse (!) 36   Temp 98.2 F (36.8 C) (Oral)   Resp 14   Ht 5' 4" (1.626 m)   Wt 36.3 kg   SpO2 97%   BMI 13.73 kg/m   Physical Exam Vitals  signs and nursing note reviewed.  Constitutional:      Appearance: She is ill-appearing.     Comments: Cachectic elderly appearing female awake and alert, very poor hearing  HENT:     Head: Normocephalic and atraumatic.  Eyes:     Conjunctiva/sclera: Conjunctivae normal.  Cardiovascular:     Rate and Rhythm: Tachycardia present. Rhythm irregular.  Pulmonary:     Effort: Pulmonary effort is  normal. No respiratory distress.     Breath sounds: Normal breath sounds. No stridor.  Abdominal:     General: There is no distension.  Skin:    General: Skin is warm and dry.  Neurological:     Mental Status: She is alert and oriented to person, place, and time.     Cranial Nerves: No cranial nerve deficit.      ED Treatments / Results  Labs (all labs ordered are listed, but only abnormal results are displayed) Labs Reviewed  CBC WITH DIFFERENTIAL/PLATELET - Abnormal; Notable for the following components:      Result Value   WBC 11.0 (*)    RBC 3.66 (*)    Hemoglobin 11.1 (*)    MCV 101.6 (*)    MCHC 29.8 (*)    All other components within normal limits  COMPREHENSIVE METABOLIC PANEL - Abnormal; Notable for the following components:   BUN 28 (*)    Creatinine, Ser 1.32 (*)    Total Protein 6.1 (*)    GFR calc non Af Amer 37 (*)    GFR calc Af Amer 43 (*)    Anion gap 17 (*)    All other components within normal limits  LIPID PANEL - Abnormal; Notable for the following components:   Cholesterol 250 (*)    Triglycerides 194 (*)    LDL Cholesterol 142 (*)    All other components within normal limits  CBG MONITORING, ED - Abnormal; Notable for the following components:   Glucose-Capillary 44 (*)    All other components within normal limits  CBG MONITORING, ED - Abnormal; Notable for the following components:   Glucose-Capillary 174 (*)    All other components within normal limits  PROTIME-INR  APTT  HEPARIN LEVEL (UNFRACTIONATED)  CBC  I-STAT TROPONIN, ED    EKG EKG  Interpretation  Date/Time:  Sunday August 21 2018 18:23:55 EST Ventricular Rate:  112 PR Interval:    QRS Duration: 87 QT Interval:  359 QTC Calculation: 490 R Axis:   -76 Text Interpretation:  Atrial fibrillation Left anterior fascicular block Anteroseptal infarct, age indeterminate ST-t wave abnormality Abnormal ekg Confirmed by Carmin Muskrat [phone]) on 08/21/2018 6:29:17 PM   Radiology No results found.  Procedures Procedures (including critical care time)  Medications Ordered in ED Medications  0.9 %  sodium chloride infusion (has no administration in time range)  aspirin chewable tablet 324 mg (324 mg Oral Not Given 08/21/18 1824)  heparin ADULT infusion 100 units/mL (25000 units/256m sodium chloride 0.45%) (has no administration in time range)  heparin injection 3,000 Units (3,000 Units Intravenous Given 08/21/18 1825)  dextrose 50 % solution 50 mL (50 mLs Intravenous Given 08/21/18 1827)  sodium chloride 0.9 % bolus 1,000 mL (1,000 mLs Intravenous New Bag/Given 08/21/18 1825)     Initial Impression / Assessment and Plan / ED Course  I have reviewed the triage vital signs and the nursing notes.  Pertinent labs & imaging results that were available during my care of the patient were reviewed by me and considered in my medical decision making (see chart for details).    Patient's rhythm strips reviewed, consistent with initial EKG here, with concern for ST depressions throughout the inferior leads, elevated aVR. Given the patient's initial hypotension, with a systolic less than 70 she received fluid resuscitation. Patient's case managed with our cardiology colleagues given concern for acute coronary ischemia. However, the patient has had no additional chest pain after that moment at home. Has  received heparin, fluids, aspirin.  Update: Patient's family members arrived, they corroborated the history provided by [facility] providers. They note that the patient continues to  smoke cigarettes. With them at present, and with her, we discussed goals of care, the patient is designated DNR, though medical management will continue.  7:44 PM Initial labs reassuring, though the patient's pain was just prior to ED arrival.  Blood pressure has improved.  I discussed her case with her cardiology colleagues, and though she has a history of A. fib, she has no history of ischemic disease. With resolution of pain here, though the EKG is abnormal, she will be followed by our cardiology colleagues, but will have medical management including heparin, amiodarone given her atrial fibrillation. Patient will be admitted to our hospitalist colleagues.  Final Clinical Impressions(s) / ED Diagnoses  Atypical chest pain Atrial fibrillation Hypotension   CRITICAL CARE Performed by: Carmin Muskrat Total critical care time: 35 minutes Critical care time was exclusive of separately billable procedures and treating other patients. Critical care was necessary to treat or prevent imminent or life-threatening deterioration. Critical care was time spent personally by me on the following activities: development of treatment plan with patient and/or surrogate as well as nursing, discussions with consultants, evaluation of patient's response to treatment, examination of patient, obtaining history from patient or surrogate, ordering and performing treatments and interventions, ordering and review of laboratory studies, ordering and review of radiographic studies, pulse oximetry and re-evaluation of patient's condition.    Carmin Muskrat, MD 08/21/18 1945

## 2018-08-21 NOTE — Progress Notes (Signed)
MD on call notified via Amion of pts Trop 0.22. Pt remains CP free, VS Stable. Family at bedside, will continue to monitor. Jessie Foot, RN

## 2018-08-21 NOTE — ED Triage Notes (Addendum)
Per EMS- pt here for eval of STEMI, chest pain that started 1 hour ago that has resolved. She wears 2L O2 at all times. 324 of Asprin given. No IV access. Pt feels short of breath when she is sitting upright. Pt denies pain currently. Hypotensive in route in the 90s.

## 2018-08-21 NOTE — Progress Notes (Signed)
Chaplain responded to Code STEMI. Chaplain asked patient about family members coming and greeted them in the ED waiting area. Son, brother and sister in law were present.  Provided ministry of presence until the Code was cancelled.   Tamsen Snider Pager [phone]

## 2018-08-21 NOTE — H&P (Addendum)
Date: 08/21/2018               Patient Name:  Stacy Byrd MRN: [id number]  DOB: August 07, 1931 Age / Sex: 85 y.o., female   PCP: Stacy Adie, MD         Medical Service: Internal Medicine Teaching Service         Attending Physician: Dr. Lucious Groves, DO    First Contact: Dr. Koleen Byrd Pager: [phone]  Second Contact: Dr. Frederico Byrd Pager: [phone]       After Hours (After 5p/  First Contact Pager: [phone]  weekends / holidays): Second Contact Pager: [phone]   Chief Complaint: chest pain  History of Present Illness:   85 yo female with a.fib, copd, pre-diabetes, gerd, hld, htn, arthritis, breast cancer s/p left mastectomy who presents via EMS for evaluation of chest pain.  Ms. Cost is accompanied by her son, daughter, and son-in-law. She states that she was laying down at home when she experienced a sudden pain in the middle of her chest. She is unable to describe the nature of the pain but states that it wasn't sharp, dull, burning, or pressure-like. She rated it a 6 out of 10 and stated that it remained constant until EMS arrived (presumable 10-20 minutes). She denies any associated symptoms but the son arrived shortly after the chest pain began and reports that his mom was diaphoretic, appeared short of breath, confused (didn't seem to know who he was), and dizzy when she tried to stand up. He also states that her sugar was 10 when EMS checked it but I can't find any documentation of this.   Currently, [name] is back to baseline (family also corroborates this) and she denies any symptoms, including chest pain.   Further history from the family reveals that her overall functional capacity has decreased in the last several weeks and her appetite has been very poor. She has only left the house twice in the last 6 weeks and spends most of her days sitting on the couch watching tv. She has an aide that comes 3 days/week. [facility] no longer [facility] to [facility].   Per EMS report, Ms.  Cousin was experiencing chest pain when they arrived at her home. EKG at that time showed diffuse inferior ST depressions and ST elevation in aVR, as well as a. Fib. Initial HR in the 60s and SBP 70s. On arrival to the ED, she was hypotensive and tachycardic with chest pain and ekg changes, therefore, code STEMI was called.   Meds:  Current Meds  Medication Sig  . acetaminophen-codeine (TYLENOL #3) 300-30 MG tablet Take 1 tablet by mouth 2 (two) times daily. For arthritis  . albuterol (PROVENTIL HFA;VENTOLIN HFA) 108 (90 BASE) MCG/ACT inhaler Inhale 2 puffs into the lungs every 4 (four) hours as needed for wheezing. (Patient taking differently: Inhale 2 puffs into the lungs 2 (two) times daily. )  . benzonatate (TESSALON) 100 MG capsule TAKE ONE CAPSULE BY MOUTH 3 TIMES A DAY AS NEEDED FOR COUGH (Patient taking differently: Take 100 mg by mouth 3 (three) times daily as needed for cough. )  . bisoprolol-hydrochlorothiazide (ZIAC) 10-6.25 MG per tablet TAKE 1 TABLET EVERY MORNING (Patient taking differently: Take 1 tablet by mouth daily. For high blood pressure)  . Calcium Carbonate Antacid 600 MG chewable tablet Chew 600 mg by mouth 2 (two) times daily. For bone strength  . Cholecalciferol (VITAMIN D3) 50 MCG (2000 UT) TABS Take 2,000 Units by  mouth daily.  Marland Kitchen ENSURE PLUS (ENSURE PLUS) LIQD Take 237 mLs by mouth 3 (three) times daily between meals.  . Melatonin 3 MG TABS Take 3 mg by mouth at bedtime.  . ondansetron (ZOFRAN) 4 MG tablet Take 4 mg by mouth daily as needed for nausea or vomiting.  . pantoprazole (PROTONIX) 40 MG tablet Take 1 tablet (40 mg total) by mouth daily. (Patient taking differently: Take 40 mg by mouth 2 (two) times daily. For heartburn)  . senna-docusate (SENNA S) 8.6-50 MG tablet Take 2 tablets by mouth at bedtime. For constipation     Allergies: Allergies as of 08/21/2018  . (No Known Allergies)   Past Medical History:  Diagnosis Date  . Anxiety   . Arthritis    RA  ALL OVER "  . Breast cancer ([place])   . COPD (chronic obstructive pulmonary disease) ([place])   . Depression   . Diabetes mellitus without complication ([place])   . GERD (gastroesophageal reflux disease)   . Hyperlipidemia   . Hypertension   . Hypertensive CKD (chronic kidney disease)   . Insomnia   . Prediabetes   . Shortness of breath   . Unspecified protein-calorie malnutrition ([place]) 06/19/2013  . Vitamin D deficiency     Family History: heart disease (mom, dad, daughter). Lung cancer (daughter)  Social History: lives at home, alone. Has an aide that comes 3 days/week and helps with grocery shopping and cleaning. Son lives 6 minutes away and visits often. Daughter lives in [place]. Current smoker (1-2 cigs/day, used to smoke 1/2ppd), has >30year pack year smoking history. Denies alcohol or other drug use. Denies OTC or herbal supplements.   Review of Systems: Review of Systems  Constitutional: Negative for chills and fever.  HENT: Negative for sore throat.   Eyes: Negative for blurred vision.  Respiratory: Negative for cough and shortness of breath.   Cardiovascular: Negative for palpitations.  Gastrointestinal: Negative for abdominal pain, diarrhea, nausea and vomiting.  Genitourinary: Negative for dysuria and frequency.  Musculoskeletal: Negative for falls.  Neurological: Negative for dizziness, tingling, sensory change and headaches.    Physical Exam: Initial V/S: 98.66F, pulse 102, RR 19, BP 73/60 Blood pressure 115/63, pulse 67, temperature 98.2 F (36.8 C), temperature source Oral, resp. rate 17, height 5\' 4"  (1.626 m), weight 36.3 kg, SpO2 100 %. Vitals:   08/21/18 1947 08/21/18 1948 08/21/18 2000 08/21/18 2045  BP:   (!) 113/99 115/63  Pulse:   67 67  Resp:   16 17  Temp:      TempSrc:      SpO2: 100% 100% 100% 100%  Weight:      Height:       General: Vital signs reviewed.  Patient is thin, elderly appearing female, in no acute distress and cooperative with  exam.  Head: Normocephalic and atraumatic. Eyes: EOMI, conjunctivae normal, no scleral icterus.  Neck: Supple, trachea midline, normal ROM, no JVD, masses, Cardiovascular: RRR, S1 normal, S2 normal, no murmurs, gallops, or rubs. Pulmonary/Chest: Decreased breath sounds throughout but Clear to auscultation bilaterally, no wheezes, rales, or rhonchi. Abdominal: Soft, non-tender, non-distended, BS +, no masses, organomegaly, or guarding present.  Musculoskeletal: No joint deformities, erythema, or stiffness, ROM full and nontender. Extremities: No lower extremity edema bilaterally,  pulses symmetric and intact bilaterally. No cyanosis or clubbing. Neurological: A&O x3, Strength is normal and symmetric bilaterally, cranial nerve II-XII are grossly intact, no focal motor deficit, sensory intact to light touch bilaterally.  Skin: Warm, dry  and intact. No rashes or erythema. Psychiatric: Normal mood and affect. speech and behavior is normal. Cognition and memory are normal.   Labs:  Troponin 0.03 CBG 44 T. Chol 250, HDL 69, LDL 142  Creatine 1.32 (unclear baseline, last labs were in 2015 and creatine was 0.92) BUN 28 AG 17  WBC 11, elevated lymphocytes (5.9), plasmacytoid lymphs, smudge cells  Hgb 11.1 MCV 101.6  INR 1.01   EKG: personally reviewed my interpretation is HR 112, A. Fib, ST elevation in aVR. ST depressions in II, III, aVF   Assessment & Plan by Problem: Active Problems:   ACS (acute coronary syndrome) ([place])   Ms. Diekman is a pleasant 85 yo female with a.fib, copd, pre-diabetes, gerd, hld, htn, arthritis, breast cancer s/p left mastectomy who presents with substernal chest pain, hypotension, a. Fib, hypoglycemia, and EKG changes concerning for ACS vs STEMI.    ACS: Substernal chest pain with ST elevation in aVR and ST depressions in II, III, aVF. Self resolved after 10-20 minutes. Initially 73/60 on presentation. Improved to 113/99 after 1L fluid bolus. Suspect her  hypotension is 2/2 decreased cardiac output from acute coronary insufficiency on top of decreased po intake. Vital signs now stable. No history of cardiac ischemia. No previous echo or heart cath. Risk factors include HLD, HTN, current smoker. She is currently chest pain free and initial troponin was negative. ST elevations in aVR with diffuse ST depressions usually indicates coranary insufficiency secondary to demand vs incomplete occlusions and only indicates acute cardiac thrombosis in 10% of cases. Therefore, this is not considered a STEMI and urgent, rather than emergent PCI is recommended. Chest pain could also be secondary to a.fib. Will trend troponins and plan for LHC sooner vs. later depending on troponins.   - cardiology consulted in the ED, appreciate their recommendations - continue IV heparin infusion  - telemetry  - trend troponins  - avoid BB and CCB given hypotension and bradycardia  - holding home bisoprolol-HCTZ and losartan - repeat EKG in the morning  - echo  - high intensity statin: atorvastatin 80mg    A. Fib: Last EKG was several years ago so not sure how acute her a. Fib is and she denies ever experiencing palpitations. She remains rate controlled < 110 HR, vital signs stable. Started on amiodarone drip in the ED. (BPs too soft to tolerate diltiazem).  - in sinus rhythm on my exam  - continue amiodarone drip  - CHADsVASC score indicates need for anticoagulation and HAS-BLED score suggest low risk for major bleeding. Recommend discussion with patient and family about anticoagulation.   Macrocytic anemia: Chronic. Stable. Ongoing since April 2015 with slowly increasing MCV. Family reports poor po intake for the last several weeks. Could be B12/folate deficiency though she denies neuropathy. Labs also reveal increased lymphocyte count of 5.9 and smudge cells, concerning for CLL. However she denies night sweats, bone pain, frequent infections, or fevers and I did not appreciate  any lymphadenopathy on my exam.  - check B12, folate   - f/u blood smear   Elevated Creatine: dehydration vs. Progression of chronic kidney disease. Etiology is uncertain as there are no recent labs for comparison.  - trial IVFs, then recheck BMP   Hypoglycemia: CBG 44 on arrival. Given one amp of D50 and CBG now 174. I expect some her symptoms at home (diaphoresis, confusion, dizziness) were driven by hypoglycemia 2/2 poor po intake. Glucose is now normal and symptoms have resolved. She was previously diagnosed with pre-diabetes  and takes no diabetes medications. - routine CBGs  - heart healthy diet, ensures tid wc  Elevated Anion Gap:  Could be secondary to prolonged starvation and/or thiamine def. Doubt lactic acidosis since she is clinically well appearing and only mildly elevated white count and creatine.  - repeat BMP tomorrow  COPD: home inhaled albuterol and ICS+LAMA  Insomnia: home melatonin   IVFs: LR 100cc/hr Diet: heart healthy  Code: DNR, confirmed with patient   Dispo: Admit patient to Inpatient with expected length of stay greater than 2 midnights.  Signed: Isabelle Course, MD 08/21/2018, 11:19 PM  Pager: [phone]

## 2018-08-21 NOTE — Consult Note (Addendum)
Cardiology Consult   Patient ID: Stacy Byrd, MRN: [id number], DOB: May 14, 1931   Date of Encounter: 08/21/2018, 7:45 PM  Primary Care Provider: Unk Pinto, MD Cardiologist: NA Electrophysiologist:  NA  Chief Complaint:  CP, possible STEMI  History of Present Illness: Stacy Byrd is a 85 y.o. female w/ no prior cardiac hx (no CAD or intervention), now presents via EMS as possible STEMI. Pt states she had sudden onset of CP at rest earlier today; she describes her discomfort as a midsternal and L-sided "heaviness" that was severe. It lasted for 10-20 min and was still ongoing when family members arrived at her home, but resolved by the time EMS reached the home. It was associated with SOB although pt states she is "always" SOB due to her chronic COPD. EMS did 12-lead which showed diffuse inferior ST depression as well as 60mm STE in aVR, as well as afib. Initial HR were in the 60s, but upon arrival at [facility] HR had risen to >100. Unclear how acute the afib might be as last EKG was several years back. Pt was also hypotensive when EMS arrived, with SBP in the 70s. BP responded favorably to fluid bolus in the ED. No prior LHC or TTE on record.  Past Medical History:  Diagnosis Date  . Anxiety   . Arthritis    RA ALL OVER "  . Breast cancer ([place])   . COPD (chronic obstructive pulmonary disease) ([place])   . Depression   . Diabetes mellitus without complication ([place])   . GERD (gastroesophageal reflux disease)   . Hyperlipidemia   . Hypertension   . Hypertensive CKD (chronic kidney disease)   . Insomnia   . Prediabetes   . Shortness of breath   . Unspecified protein-calorie malnutrition ([place]) 06/19/2013  . Vitamin D deficiency     Past Surgical History:  Procedure Laterality Date  . APPENDECTOMY    . BREAST SURGERY  1998     Prior to Admission medications   Medication Sig Start Date End Date Taking? Authorizing Provider  acetaminophen (TYLENOL) 325 MG tablet Take 2 tablets  (650 mg total) by mouth every 6 (six) hours as needed for mild pain, moderate pain, fever or headache (or Fever >/= 101). 11/27/13   Hongalgi, Lenis Dickinson, MD  albuterol (PROVENTIL HFA;VENTOLIN HFA) 108 (90 BASE) MCG/ACT inhaler Inhale 2 puffs into the lungs every 4 (four) hours as needed for wheezing. 08/30/13   Kelby Aline, PA-C  ALPRAZolam Duanne Moron) 0.5 MG tablet Take 0.5 mg by mouth 3 (three) times daily as needed for anxiety.    [provider]  aspirin EC 81 MG EC tablet Take 1 tablet (81 mg total) by mouth daily. 11/27/13   Hongalgi, Lenis Dickinson, MD  benzonatate (TESSALON) 100 MG capsule TAKE ONE CAPSULE BY MOUTH 3 TIMES A DAY AS NEEDED FOR COUGH    Vicie Mutters, PA-C  bisoprolol-hydrochlorothiazide (ZIAC) 10-6.25 MG per tablet TAKE 1 TABLET EVERY MORNING    Unk Pinto, MD  cholecalciferol (VITAMIN D) 1000 UNITS tablet Take 1,000 Units by mouth daily.    [provider]  Fluticasone-Salmeterol (ADVAIR DISKUS) 250-50 MCG/DOSE AEPB Inhale 1 puff into the lungs 2 (two) times daily. 11/27/13   Hongalgi, Lenis Dickinson, MD  hyoscyamine (LEVSIN, ANASPAZ) 0.125 MG tablet Take 1 tablet (0.125 mg total) by mouth every 4 (four) hours as needed. 11/28/13   Unk Pinto, MD  losartan (COZAAR) 100 MG tablet Take 1 tablet (100 mg total) by mouth daily. 12/27/13 12/27/14  Tamala Julian, Melissa, PA-C  ondansetron (ZOFRAN-ODT) 4 MG disintegrating tablet TAKE 1 TABLET BY MOUTH EVERY 4 HOURS AS NEEDED FOR NAUSEA AND VOMITING 01/04/14   Vicie Mutters, PA-C  pantoprazole (PROTONIX) 40 MG tablet Take 1 tablet (40 mg total) by mouth daily. 11/27/13   Modena Jansky, MD  pravastatin (PRAVACHOL) 40 MG tablet TAKE 1 TABLET BY MOUTH AT BEDTIME 04/01/14   Unk Pinto, MD  prochlorperazine (COMPAZINE) 5 MG tablet Take 1 tablet 3 x day before meals for nausea 11/30/13   Unk Pinto, MD  promethazine (PHENERGAN) 12.5 MG tablet Take 1 tablet (12.5 mg total) by mouth every 6 (six) hours as needed for nausea or  vomiting. 01/05/14   Vicie Mutters, PA-C  ranitidine (ZANTAC) 150 MG tablet  10/11/13   [provider]  Spacer/Aero-Holding Chambers (AEROCHAMBER PLUS FLO-VU LARGE) MISC 1 each by Other route once. 07/30/13   Luana Shu, Freeman Caldron, PA-C  tiotropium (SPIRIVA HANDIHALER) 18 MCG inhalation capsule Place 1 capsule (18 mcg total) into inhaler and inhale daily. 11/27/13   Modena Jansky, MD     Allergies: No Known Allergies  Social History:  The patient  reports that she has been smoking cigarettes. She has a 32.50 pack-year smoking history. She has never used smokeless tobacco. She reports that she does not drink alcohol or use drugs.   Family History:  The patient's family history includes Cancer in her daughter; Diabetes in her daughter; Heart disease in her daughter, father, and mother.   ROS:  Please see the history of present illness.     All other systems reviewed and negative.   Vital Signs: Blood pressure (!) 84/57, pulse (!) 36, temperature 98.2 F (36.8 C), temperature source Oral, resp. rate 14, height 5\' 4"  (1.626 m), weight 36.3 kg, SpO2 97 %.  PHYSICAL EXAM: General:  Petite and thin, mildly cachectic, NAD HEENT: hard of hearing Lymph: no adenopathy Neck: no JVD Endocrine:  No thryomegaly Vascular: No carotid bruits; DP pulses not palpable  Cardiac:  normal S1, S2; RRR; no murmur  Lungs:  clear to auscultation bilaterally, no wheezing, rhonchi or rales  Abd: soft, nontender, no hepatomegaly  Ext: no edema Musculoskeletal:  No deformities, BUE and BLE strength normal and equal Skin: warm and dry  Neuro:  CNs 2-12 intact, no focal abnormalities noted Psych:  Normal affect   EKG:  afib w/ HR 112, LAFB, inferior ST depression, 15mm STE in aVR, and aVL  Labs:   Lab Results  Component Value Date   WBC 11.0 (H) 08/21/2018   HGB 11.1 (L) 08/21/2018   HCT 37.2 08/21/2018   MCV 101.6 (H) 08/21/2018   PLT 244 08/21/2018   Recent Labs  Lab 08/21/18 1523  NA 145  K  4.1  CL 100  CO2 28  BUN 28*  CREATININE 1.32*  CALCIUM 9.9  PROT 6.1*  BILITOT 1.2  ALKPHOS 39  ALT 8  AST 23  GLUCOSE 77   No results for input(s): CKTOTAL, CKMB, TROPONINI in the last 72 hours. Troponin (Point of Care Test) Recent Labs    08/21/18 1829  TROPIPOC 0.03    Lab Results  Component Value Date   CHOL 250 (H) 08/21/2018   HDL 69 08/21/2018   LDLCALC 142 (H) 08/21/2018   TRIG 194 (H) 08/21/2018   No results found for: DDIMER  Radiology/Studies:  No results found.    ASSESSMENT AND PLAN:   1. STEMI: ST elevation in aVR suggestive of prox LAD, diffuse  3V CAD or LM disease. Pt has afib; not clear if maybe afib is acute in onset and set off pt's c/o chest discomfort as well as additional demand that is responsible for ST changes currently. At this time pt is pain-free and wishes to be DNR. Initial trop is negative. For now will try to get afib under a little better control and take a more conservative course; if pt develops CP or worsening sx overnight it may send her to [facility] sooner rather than later but o/w will plan on medical tx at present w/ possible LHC later. Would recommend heparin IV gtt for ACS. Cycle troponins. Avoid BB given hypotension and bradycardia; will deal w/ afib via amiodarone bolus/gtt.  2. Afib: would avoid BB/CCB given hypotension on arrival and relatively low BP at present time. Will use amiodarone to try to get her rate under better control for now. She will be on heparin iv gtt for the time being; she has indication for [facility], but may or may not be a candidate for long-term [facility]; this will have to be discussed w/ family and patient going forward.  3. HTN/DM2: would restart home med regimen and titrate PRN.  4. Dyslipidemia: LDL is 142; goal would be <70. She is currently on pravastatin. Would recommend escalate to high-intensity therapy w/ atorva 80 if tolerated.  Thank you for the opportunity to participate in the care of this patient. Will  follow. Please call w/ questions.   Signed,  Rudean Curt, MD, [facility] 08/21/2018 7:45 PM    Agree with above. Patient seen & examiner personally. D/w patient and her family as well as Drs. Vanita Panda and [place] at bedside.  85 y/o woman with advanced COPD (still smoking), DM2, severe cachexia. No known h/o CAD. [place] to ED today for episode of CP and AMS. Currently CP free. ECG with AF (? chronicity) with isolated ST elevation in AVR and diffuse ST depression elsewhere. Initial troponin negative. Was hypotensive with SBP in 70s on arrival to ER but we gave her ~ 500cc nS and SBP now mid 90s.   On exam patient very elderly cachetic frail and HOH. JVP 7-8 Cor distant irregular Lungs severel decreased BS throughout. No active wheeze Ab NT/ND Ext: severely cachetic    A/P 1. ACS (not clear STEMI) 2. AF - unclear chronicity 3. Advanced COPD 4. Severe cachexia 5. Hypotension  Suspect AF may be trigger for CP. ECG suggestive of LM disease (or 3v CAD equivalent) but does not meet STEMI criteria. Patient now pain free and BP improved. Will admit to IM service (patient part of [facility] program). Start heparin and amio. Can consider cath but she is extremely cachetic and almost certainly has severe multivessel CAD which may likely be not amenable to PCI & she is not a surgical candidate. Will control AF and hydrate gently. Check echo. Then revisit cath versus medical therapy.  Long discussion with patient and family and she is now DNR./DNI. Patient says she stopped smoking but family says they have all caught her smoking recently.   CRITICAL CARE Performed by: Glori Bickers  Total critical care time: 45 minutes  Critical care time was exclusive of separately billable procedures and treating other patients.  Critical care was necessary to treat or prevent imminent or life-threatening deterioration.  Critical care was time spent personally by me (independent of midlevel providers or  residents) on the following activities: development of treatment plan with patient and/or surrogate as well as nursing, discussions with consultants,  evaluation of patient's response to treatment, examination of patient, obtaining history from patient or surrogate, ordering and performing treatments and interventions, ordering and review of laboratory studies, ordering and review of radiographic studies, pulse oximetry and re-evaluation of patient's condition.  Glori Bickers, MD  10:11 PM

## 2018-08-22 ENCOUNTER — Other Ambulatory Visit: Payer: Self-pay

## 2018-08-22 ENCOUNTER — Inpatient Hospital Stay (HOSPITAL_COMMUNITY): Payer: Medicare (Managed Care)

## 2018-08-22 ENCOUNTER — Encounter (HOSPITAL_COMMUNITY): Payer: Self-pay | Admitting: *Deleted

## 2018-08-22 DIAGNOSIS — E785 Hyperlipidemia, unspecified: Secondary | ICD-10-CM

## 2018-08-22 DIAGNOSIS — I2510 Atherosclerotic heart disease of native coronary artery without angina pectoris: Secondary | ICD-10-CM

## 2018-08-22 DIAGNOSIS — E7800 Pure hypercholesterolemia, unspecified: Secondary | ICD-10-CM

## 2018-08-22 DIAGNOSIS — G4700 Insomnia, unspecified: Secondary | ICD-10-CM

## 2018-08-22 DIAGNOSIS — I480 Paroxysmal atrial fibrillation: Secondary | ICD-10-CM

## 2018-08-22 DIAGNOSIS — I214 Non-ST elevation (NSTEMI) myocardial infarction: Secondary | ICD-10-CM

## 2018-08-22 DIAGNOSIS — Z853 Personal history of malignant neoplasm of breast: Secondary | ICD-10-CM

## 2018-08-22 DIAGNOSIS — J9611 Chronic respiratory failure with hypoxia: Secondary | ICD-10-CM

## 2018-08-22 DIAGNOSIS — Z681 Body mass index (BMI) 19 or less, adult: Secondary | ICD-10-CM

## 2018-08-22 DIAGNOSIS — J449 Chronic obstructive pulmonary disease, unspecified: Secondary | ICD-10-CM

## 2018-08-22 DIAGNOSIS — Z79899 Other long term (current) drug therapy: Secondary | ICD-10-CM

## 2018-08-22 DIAGNOSIS — Z7951 Long term (current) use of inhaled steroids: Secondary | ICD-10-CM

## 2018-08-22 DIAGNOSIS — R7303 Prediabetes: Secondary | ICD-10-CM

## 2018-08-22 DIAGNOSIS — Z9012 Acquired absence of left breast and nipple: Secondary | ICD-10-CM

## 2018-08-22 DIAGNOSIS — I959 Hypotension, unspecified: Secondary | ICD-10-CM

## 2018-08-22 DIAGNOSIS — N289 Disorder of kidney and ureter, unspecified: Secondary | ICD-10-CM

## 2018-08-22 DIAGNOSIS — I10 Essential (primary) hypertension: Secondary | ICD-10-CM

## 2018-08-22 DIAGNOSIS — F17210 Nicotine dependence, cigarettes, uncomplicated: Secondary | ICD-10-CM

## 2018-08-22 DIAGNOSIS — D519 Vitamin B12 deficiency anemia, unspecified: Secondary | ICD-10-CM

## 2018-08-22 DIAGNOSIS — D539 Nutritional anemia, unspecified: Secondary | ICD-10-CM

## 2018-08-22 DIAGNOSIS — M1990 Unspecified osteoarthritis, unspecified site: Secondary | ICD-10-CM

## 2018-08-22 DIAGNOSIS — R636 Underweight: Secondary | ICD-10-CM

## 2018-08-22 DIAGNOSIS — I361 Nonrheumatic tricuspid (valve) insufficiency: Secondary | ICD-10-CM

## 2018-08-22 DIAGNOSIS — Z66 Do not resuscitate: Secondary | ICD-10-CM

## 2018-08-22 DIAGNOSIS — K219 Gastro-esophageal reflux disease without esophagitis: Secondary | ICD-10-CM

## 2018-08-22 DIAGNOSIS — R7989 Other specified abnormal findings of blood chemistry: Secondary | ICD-10-CM

## 2018-08-22 LAB — CBC WITH DIFFERENTIAL/PLATELET
Basophils Absolute: 0 10*3/uL (ref 0.0–0.1)
Basophils Relative: 0 %
EOS ABS: 0 10*3/uL (ref 0.0–0.5)
Eosinophils Relative: 0 %
HCT: 30.8 % — ABNORMAL LOW (ref 36.0–46.0)
Hemoglobin: 9.1 g/dL — ABNORMAL LOW (ref 12.0–15.0)
LYMPHS ABS: 2.2 10*3/uL (ref 0.7–4.0)
Lymphocytes Relative: 24 %
MCH: 29.6 pg (ref 26.0–34.0)
MCHC: 29.5 g/dL — ABNORMAL LOW (ref 30.0–36.0)
MCV: 100.3 fL — ABNORMAL HIGH (ref 80.0–100.0)
Monocytes Absolute: 0.5 10*3/uL (ref 0.1–1.0)
Monocytes Relative: 6 %
NEUTROS PCT: 70 %
Neutro Abs: 6.4 10*3/uL (ref 1.7–7.7)
Platelets: 185 10*3/uL (ref 150–400)
RBC: 3.07 MIL/uL — ABNORMAL LOW (ref 3.87–5.11)
RDW: 14.1 % (ref 11.5–15.5)
WBC: 9.1 10*3/uL (ref 4.0–10.5)
nRBC: 0 % (ref 0.0–0.2)
nRBC: 0 /100 WBC

## 2018-08-22 LAB — TROPONIN I
TROPONIN I: 0.97 ng/mL — AB (ref <0.03)
Troponin I: 1.3 ng/mL (ref <0.03)
Troponin I: 1.49 ng/mL (ref <0.03)
Troponin I: 1.22 ng/mL (ref <0.03)

## 2018-08-22 LAB — ECHOCARDIOGRAM COMPLETE
Height: 64 in
Weight: 1217.6 oz

## 2018-08-22 LAB — BASIC METABOLIC PANEL
Anion gap: 10 (ref 5–15)
BUN: 25 mg/dL — ABNORMAL HIGH (ref 8–23)
CO2: 28 mmol/L (ref 22–32)
Calcium: 8.6 mg/dL — ABNORMAL LOW (ref 8.9–10.3)
Chloride: 107 mmol/L (ref 98–111)
Creatinine, Ser: 0.99 mg/dL (ref 0.44–1.00)
GFR calc Af Amer: >60 mL/min (ref >60)
GFR, EST NON AFRICAN AMERICAN: 52 mL/min — AB (ref >60)
Glucose, Bld: 69 mg/dL — ABNORMAL LOW (ref 70–99)
Potassium: 3.5 mmol/L (ref 3.5–5.1)
Sodium: 145 mmol/L (ref 135–145)

## 2018-08-22 LAB — VITAMIN B12: Vitamin B-12: 150 pg/mL — ABNORMAL LOW (ref 180–914)

## 2018-08-22 LAB — HEPARIN LEVEL (UNFRACTIONATED)
Heparin Unfractionated: 0.13 IU/mL — ABNORMAL LOW (ref 0.30–0.70)
Heparin Unfractionated: 0.38 IU/mL (ref 0.30–0.70)

## 2018-08-22 LAB — FOLATE: Folate: 20.3 ng/mL (ref >5.9)

## 2018-08-22 LAB — GLUCOSE, CAPILLARY
Glucose-Capillary: 58 mg/dL — ABNORMAL LOW (ref 70–99)
Glucose-Capillary: 94 mg/dL (ref 70–99)

## 2018-08-22 LAB — PATHOLOGIST SMEAR REVIEW

## 2018-08-22 MED ORDER — ASPIRIN EC 81 MG PO TBEC
81.0000 mg | DELAYED_RELEASE_TABLET | Freq: Every day | ORAL | Status: DC
  Administered 2018-08-22 – 2018-08-24 (×3): 81 mg via ORAL
  Filled 2018-08-22 (×3): qty 1

## 2018-08-22 MED ORDER — HEPARIN BOLUS VIA INFUSION
1000.0000 [IU] | Freq: Once | INTRAVENOUS | Status: AC
  Administered 2018-08-22: 1000 [IU] via INTRAVENOUS
  Filled 2018-08-22: qty 1000

## 2018-08-22 MED ORDER — CLOPIDOGREL BISULFATE 75 MG PO TABS
75.0000 mg | ORAL_TABLET | Freq: Every day | ORAL | Status: DC
  Administered 2018-08-22 – 2018-08-24 (×3): 75 mg via ORAL
  Filled 2018-08-22 (×3): qty 1

## 2018-08-22 MED ORDER — VITAMIN B-12 1000 MCG PO TABS
1000.0000 ug | ORAL_TABLET | Freq: Every day | ORAL | Status: DC
  Administered 2018-08-22 – 2018-08-23 (×2): 1000 ug via ORAL
  Filled 2018-08-22 (×2): qty 1

## 2018-08-22 MED ORDER — MENTHOL 3 MG MT LOZG: 1.0000 | LOZENGE | OROMUCOSAL | Status: DC | PRN

## 2018-08-22 MED ORDER — FOLIC ACID 1 MG PO TABS
1.0000 mg | ORAL_TABLET | Freq: Every day | ORAL | Status: DC
  Administered 2018-08-22 – 2018-08-23 (×2): 1 mg via ORAL
  Filled 2018-08-22 (×2): qty 1

## 2018-08-22 MED ORDER — UMECLIDINIUM-VILANTEROL 62.5-25 MCG/INH IN AEPB
1.0000 | INHALATION_SPRAY | Freq: Every day | RESPIRATORY_TRACT | Status: DC
  Administered 2018-08-23 – 2018-08-24 (×2): 1 via RESPIRATORY_TRACT
  Filled 2018-08-22: qty 14

## 2018-08-22 MED ORDER — METOPROLOL TARTRATE 12.5 MG HALF TABLET
12.5000 mg | ORAL_TABLET | Freq: Two times a day (BID) | ORAL | Status: DC
  Administered 2018-08-22 – 2018-08-23 (×3): 12.5 mg via ORAL
  Filled 2018-08-22 (×3): qty 1

## 2018-08-22 NOTE — Progress Notes (Signed)
ANTICOAGULATION CONSULT NOTE - Follow-up Consult  Pharmacy Consult for heparin Indication: chest pain/ACS  No Known Allergies  Patient Measurements: Height: 5\' 4"  (162.6 cm) Weight: 76 lb 1.6 oz (34.5 kg) IBW/kg (Calculated) : 54.7 Heparin Dosing Weight: 36.3kg  Vital Signs: Temp: 98.6 F (37 C) (01/13 0338) Temp Source: Oral (01/13 0338) BP: 142/59 (01/13 0338) Pulse Rate: 64 (01/13 0338)  Labs: Recent Labs    08/21/18 1523 08/21/18 2154 08/22/18 0334  HGB 11.1*  --  9.1*  HCT 37.2  --  30.8*  PLT 244  --  185  APTT 27  --   --   LABPROT 13.2  --   --   INR 1.01  --   --   HEPARINUNFRC  --   --  0.13*  CREATININE 1.32*  --   --   TROPONINI  --  0.22*  --     Estimated Creatinine Clearance: 17 mL/min (A) (by C-G formula based on SCr of 1.32 mg/dL (H)).  Assessment: 34 yof initially presented as a Code STEMI. To start IV heparin. Heparin level subtherapeutic (0.13) on gtt at 400 units/hr. No issues with line or bleeding reported per RN.  Goal of Therapy:  Heparin level 0.3-0.7 units/ml Monitor platelets by anticoagulation protocol: Yes   Plan:  Rebolus heparin 1000 units Increase heparin gtt 550 units/hr Check an 8 hr heparin level Daily heparin level and CBC  Sherlon Handing, PharmD, BCPS Clinical pharmacist  **Pharmacist phone directory can now be found on [email or web address] ([email or web address]).  Listed under [place]. 08/22/2018,5:24 AM

## 2018-08-22 NOTE — Progress Notes (Signed)
  Echocardiogram 2D Echocardiogram has been performed.  Stacy Byrd 08/22/2018, 11:33 AM

## 2018-08-22 NOTE — Evaluation (Signed)
Physical Therapy Evaluation Patient Details Name: Stacy Byrd MRN: [id number] DOB: 07/20/1931 Today's Date: 08/22/2018   History of Present Illness  85 y.o. female presenting with angina at rest and ECG changes c/w global ischemia in the setting of AFib w RVR and hypotension.  Pt with NSTEMI.  PMH: COPD  Clinical Impression  Pt admitted with above diagnosis. Pt currently with functional limitations due to the deficits listed below (see PT Problem List). Pt was able to get to 3N1 and back to bed with min assist.  Daughter present and states that pt has been struggling at home although pt says she does ok at home and that son can check on her because he is retired.  Pt was weak today and feel that a short term SNF would be beneficial.  Will follow acutely.   Pt will benefit from skilled PT to increase their independence and safety with mobility to allow discharge to the venue listed below.      Follow Up Recommendations SNF;Supervision/Assistance - 24 hour    Equipment Recommendations  None recommended by PT    Recommendations for Other Services       Precautions / Restrictions Precautions Precautions: Fall Restrictions Weight Bearing Restrictions: No      Mobility  Bed Mobility Overal bed mobility: Needs Assistance Bed Mobility: Supine to Sit     Supine to sit: Supervision        Transfers Overall transfer level: Needs assistance Equipment used: 2 person hand held assist Transfers: Sit to/from Stand;Stand Pivot Transfers Sit to Stand: Min assist Stand pivot transfers: Min assist       General transfer comment: Pt needed steadying assist as she keeps her body flexed.  Only agreed to get on 3N1 and urinate and then back to bed.  She did clean herself.    Ambulation/Gait                Stairs            Wheelchair Mobility    Modified Rankin (Stroke Patients Only)       Balance                                              Pertinent Vitals/Pain Pain Assessment: No/denies pain    Home Living Family/patient expects to be discharged to:: Private residence Living Arrangements: Alone Available Help at Discharge: Family;Available PRN/intermittently(Aide 2 xweek, 1 1/2 hours) Type of Home: House Home Access: Stairs to enter Entrance Stairs-Rails: None Entrance Stairs-Number of Steps: 2 Home Layout: One level Home Equipment: Wheelchair - [employer] - 4 wheels;Toilet riser(2LO2)      Prior Function Level of Independence: Independent               Hand Dominance   Dominant Hand: Right    Extremity/Trunk Assessment   Upper Extremity Assessment Upper Extremity Assessment: Defer to OT evaluation    Lower Extremity Assessment Lower Extremity Assessment: Generalized weakness    Cervical / Trunk Assessment Cervical / Trunk Assessment: Kyphotic  Communication   Communication: No difficulties  Cognition Arousal/Alertness: Awake/alert Behavior During Therapy: WFL for tasks assessed/performed Overall Cognitive Status: Within Functional Limits for tasks assessed  General Comments      Exercises     Assessment/Plan    PT Assessment Patient needs continued PT services  PT Problem List Decreased activity tolerance;Decreased balance;Decreased mobility;Decreased knowledge of use of DME;Decreased safety awareness;Decreased knowledge of precautions       PT Treatment Interventions DME instruction;Gait training;Stair training;Functional mobility training;Therapeutic activities;Therapeutic exercise;Balance training;Patient/family education    PT Goals (Current goals can be found in the Care Plan section)  Acute Rehab PT Goals Patient Stated Goal: to go home PT Goal Formulation: With patient Time For Goal Achievement: 09/05/18 Potential to Achieve Goals: Good    Frequency Min 3X/week   Barriers to discharge Decreased caregiver  support      Co-evaluation               AM-PAC PT "6 Clicks" Mobility  Outcome Measure Help needed turning from your back to your side while in a flat bed without using bedrails?: None Help needed moving from lying on your back to sitting on the side of a flat bed without using bedrails?: None Help needed moving to and from a bed to a chair (including a wheelchair)?: A Little Help needed standing up from a chair using your arms (e.g., wheelchair or bedside chair)?: A Little Help needed to walk in hospital room?: A Little Help needed climbing 3-5 steps with a railing? : A Little 6 Click Score: 20    End of Session Equipment Utilized During Treatment: Gait belt;Oxygen Activity Tolerance: Patient limited by fatigue Patient left: with call bell/phone within reach;in bed;with bed alarm set;with family/visitor present Nurse Communication: Mobility status PT Visit Diagnosis: Unsteadiness on feet (R26.81);Muscle weakness (generalized) (M62.81)    Time: 3570-1779 PT Time Calculation (min) (ACUTE ONLY): 16 min   Charges:   PT Evaluation $PT Eval Moderate Complexity: [place] Pager:  [phone]  Office:  [phone]    Denice Paradise 08/22/2018, 3:59 PM

## 2018-08-22 NOTE — Progress Notes (Signed)
ANTICOAGULATION CONSULT NOTE - Follow-up Consult  Pharmacy Consult:  Heparin Indication: chest pain/ACS  No Known Allergies  Patient Measurements: Height: 5\' 4"  (162.6 cm) Weight: 76 lb 1.6 oz (34.5 kg) IBW/kg (Calculated) : 54.7 Heparin Dosing Weight: 36.3kg  Vital Signs:    Labs: Recent Labs    08/21/18 1523  08/22/18 0334 08/22/18 0637 08/22/18 1322  HGB 11.1*  --  9.1*  --   --   HCT 37.2  --  30.8*  --   --   PLT 244  --  185  --   --   APTT 27  --   --   --   --   LABPROT 13.2  --   --   --   --   INR 1.01  --   --   --   --   HEPARINUNFRC  --   --  0.13*  --  0.38  CREATININE 1.32*  --   --  0.99  --   TROPONINI  --    < > 1.30* 1.49* 1.22*   < > = values in this interval not displayed.    Estimated Creatinine Clearance: 22.6 mL/min (by C-G formula based on SCr of 0.99 mg/dL).  Assessment: 49 YOF initially presented as a Code STEMI to continue on IV heparin for NSTEMI.  Heparin level is therapeutic; no bleeding reported.  Goal of Therapy:  Heparin level 0.3-0.7 units/ml Monitor platelets by anticoagulation protocol: Yes   Plan:  Increase heparin gtt slightly to 600 units/hr Daily heparin level and CBC   Vallory Oetken D. Mina Marble, PharmD, BCPS, [place] 08/22/2018, 4:20 PM

## 2018-08-22 NOTE — Progress Notes (Signed)
   Subjective: Stacy Byrd was seen and evaluated on morning rounds. Daughter, son and grandson accompanied her at bedside. She was resting comfortably on our evaluation. She denies any further chest pain, shortness of breath, or palpitations.   Objective:  Vital signs in last 24 hours: Vitals:   08/21/18 2000 08/21/18 2045 08/21/18 2100 08/22/18 0338  BP: (!) 113/99 115/63 133/83 (!) 142/59  Pulse: 67 67 70 64  Resp: 16 17 (!) 22 18  Temp:   98.3 F (36.8 C) 98.6 F (37 C)  TempSrc:    Oral  SpO2: 100% 100% 100%   Weight:    34.5 kg  Height:       General: frail, elderly female lying in bed in NAD CV: distant heart sounds, RRR Pulm: normal respiratory effort; lungs CTA bilaterally Ext: no edema   Assessment/Plan:  Active Problems:   ACS (acute coronary syndrome) ([place])  1. NSTEMI - likely secondary to diffuse CAD with demand ischemia in the setting of afib with RVR  - appreciate cardiology following - continuing with medical management - initiated Plavix; continuing with statin and heparin  - echo pending  2. Paroxysmal Afib - received IV amiodarone - currently in NSR; could resume PO amiodarone if arrhythmia recurs - started on low dose Metoprolol per cardiology - will touch base with physician at [facility] to discuss anticoagulation moving forward  3. Hypoglycemia - may have been inaccurate in the setting of hypotension - will monitor for further hypoglycemic events while inpatient   4. Chronic hypoxic respiratory failure due to COPD - on 2L at home; respiratory status is stable; continue O2 supplementation PRN  - albuterol prn - she is prescribed Advair and Spiriva, but these were discontinued by pharmacy because she has not taken them in the last 30 days  - will touch base with [facility] provider to confirm outpatient regimen; in the mean time will start Anoro   5. Macrocytic anemia 2/2 B12 deficiency - started on C58 and folic acid   Dispo: Anticipated discharge in  approximately 2 day(s).   Modena Nunnery D, DO 08/22/2018, 11:42 AM Pager: [phone]

## 2018-08-22 NOTE — Progress Notes (Signed)
Progress Note  Patient Name: Stacy Byrd Date of Encounter: 08/22/2018  Primary Cardiologist: No primary care provider on file. New ([place])  Subjective   No new episodes of atrial fibrillation. No chest pain. No change in baseline dyspnea. Lying fully flat in bed.  Inpatient Medications    Scheduled Meds: . aspirin  324 mg Oral Once  . aspirin EC  81 mg Oral Daily  . atorvastatin  80 mg Oral q1800  . feeding supplement  237 mL Oral TID BM  . folic acid  1 mg Oral Daily  . Melatonin  3 mg Oral QHS  . pantoprazole  40 mg Oral BID  . vitamin B-12  1,000 mcg Oral Daily   Continuous Infusions: . sodium chloride 10 mL/hr at 08/21/18 1954  . heparin 550 Units/hr (08/22/18 0606)  . lactated ringers 100 mL/hr at 08/21/18 2321   PRN Meds: acetaminophen **OR** acetaminophen, albuterol, promethazine, senna-docusate   Vital Signs    Vitals:   08/21/18 2000 08/21/18 2045 08/21/18 2100 08/22/18 0338  BP: (!) 113/99 115/63 133/83 (!) 142/59  Pulse: 67 67 70 64  Resp: 16 17 (!) 22 18  Temp:   98.3 F (36.8 C) 98.6 F (37 C)  TempSrc:    Oral  SpO2: 100% 100% 100%   Weight:    34.5 kg  Height:        Intake/Output Summary (Last 24 hours) at 08/22/2018 0938 Last data filed at 08/22/2018 0342 Gross per 24 hour  Intake 1300 ml  Output 100 ml  Net 1200 ml   Last 3 Weights 08/22/2018 08/21/2018 11/30/2013  Weight (lbs) 76 lb 1.6 oz 80 lb 96 lb  Weight (kg) 34.519 kg 36.288 kg 43.545 kg      Telemetry    NSR - Personally Reviewed  ECG    AFib w RVR and striking ST changes c/w multivessel ischemia, possible LM disease 08/22/2018)  - Personally Reviewed Today's tracing is pending  Physical Exam  Appears very frail and cachectic, but comfortable. GEN: No acute distress.   Neck: 4-5 cm JVD Cardiac: RRR, no murmurs, rubs, or gallops.  Respiratory: distant breath sounds, but clear to auscultation bilaterally. GI: Soft, nontender, non-distended  MS: No edema; No  deformity. Neuro:  Nonfocal  Psych: Normal affect   Labs    Chemistry Recent Labs  Lab 08/21/18 1523 08/22/18 0637  NA 145 145  K 4.1 3.5  CL 100 107  CO2 28 28  GLUCOSE 77 69*  BUN 28* 25*  CREATININE 1.32* 0.99  CALCIUM 9.9 8.6*  PROT 6.1*  --   ALBUMIN 3.8  --   AST 23  --   ALT 8  --   ALKPHOS 39  --   BILITOT 1.2  --   GFRNONAA 37* 52*  GFRAA 43* >60  ANIONGAP 17* 10     Hematology Recent Labs  Lab 08/21/18 1523 08/22/18 0334  WBC 11.0* 9.1  RBC 3.66* 3.07*  HGB 11.1* 9.1*  HCT 37.2 30.8*  MCV 101.6* 100.3*  MCH 30.3 29.6  MCHC 29.8* 29.5*  RDW 14.1 14.1  PLT 244 185    Cardiac Enzymes Recent Labs  Lab 08/21/18 2154 08/22/18 0334 08/22/18 0637  TROPONINI 0.22* 1.30* 1.49*    Recent Labs  Lab 08/21/18 1829  TROPIPOC 0.03     BNPNo results for input(s): BNP, PROBNP in the last 168 hours.   DDimer No results for input(s): DDIMER in the last 168 hours.  Radiology    No results found.  Cardiac Studies   Echo pending  Patient Profile     85 y.o. female presenting with angina at rest and ECG changes c/w global ischemia in the setting of AFib w RVR and hypotension.  Assessment & Plan    1. NSTEMI: most likely demand ischemia in the setting of previously undiagnosed LM stenosis or multivessel CAD, triggered by AF w RVR. Cannot exclude a true acute coronary event, but agree that she would be a poor revascularization candidate. Statin started. On heparin IV. Start clopidogrel (she is not a CABG candidate). BP may allow a low dose of selective beta blocker (avoid carvedilol due to COPD). Renal function has improved and would allow cardiac cath, should we decide to pursue an invasive route (depending on LV function on echo). Medical therapy is preferable in view of poor overall functional and nutritional status. 2. AFIB: on IV heparin and received amiodarone. Plan PO amiodarone for arrhythmia if it recurs. 3. Anemia: macrocytic. Atypical lymphs  seen, consider bone marrow disorder. Drop since yesterday could be dilutional. 4. COPD:  Major functional limitation. 5. HLP: started atorvastatin. LDL target <70.     For questions or updates, please contact [place] Please consult [email or web address] for contact info under        Signed, Sanda Klein, MD  08/22/2018, 9:38 AM

## 2018-08-23 DIAGNOSIS — Z9981 Dependence on supplemental oxygen: Secondary | ICD-10-CM

## 2018-08-23 DIAGNOSIS — Z9114 Patient's other noncompliance with medication regimen: Secondary | ICD-10-CM

## 2018-08-23 LAB — BASIC METABOLIC PANEL
Anion gap: 7 (ref 5–15)
BUN: 19 mg/dL (ref 8–23)
CO2: 31 mmol/L (ref 22–32)
Calcium: 8.6 mg/dL — ABNORMAL LOW (ref 8.9–10.3)
Chloride: 105 mmol/L (ref 98–111)
Creatinine, Ser: 0.97 mg/dL (ref 0.44–1.00)
GFR calc Af Amer: >60 mL/min (ref >60)
GFR calc non Af Amer: 53 mL/min — ABNORMAL LOW (ref >60)
Glucose, Bld: 95 mg/dL (ref 70–99)
Potassium: 3.5 mmol/L (ref 3.5–5.1)
Sodium: 143 mmol/L (ref 135–145)

## 2018-08-23 LAB — CBC
HCT: 32 % — ABNORMAL LOW (ref 36.0–46.0)
Hemoglobin: 9.7 g/dL — ABNORMAL LOW (ref 12.0–15.0)
MCH: 29.8 pg (ref 26.0–34.0)
MCHC: 30.3 g/dL (ref 30.0–36.0)
MCV: 98.2 fL (ref 80.0–100.0)
Platelets: 183 10*3/uL (ref 150–400)
RBC: 3.26 MIL/uL — ABNORMAL LOW (ref 3.87–5.11)
RDW: 14 % (ref 11.5–15.5)
WBC: 9.6 10*3/uL (ref 4.0–10.5)
nRBC: 0 % (ref 0.0–0.2)

## 2018-08-23 LAB — HEPARIN LEVEL (UNFRACTIONATED)
Heparin Unfractionated: 0.29 IU/mL — ABNORMAL LOW (ref 0.30–0.70)
Heparin Unfractionated: 0.68 IU/mL (ref 0.30–0.70)

## 2018-08-23 LAB — GLUCOSE, CAPILLARY: Glucose-Capillary: 80 mg/dL (ref 70–99)

## 2018-08-23 MED ORDER — BISOPROLOL-HYDROCHLOROTHIAZIDE 10-6.25 MG PO TABS
1.0000 | ORAL_TABLET | Freq: Every day | ORAL | Status: DC
  Administered 2018-08-23: 1 via ORAL
  Filled 2018-08-23 (×2): qty 1

## 2018-08-23 MED ORDER — ALUM & MAG HYDROXIDE-SIMETH 200-200-20 MG/5ML PO SUSP
30.0000 mL | ORAL | Status: DC | PRN
  Administered 2018-08-23 – 2018-08-24 (×2): 30 mL via ORAL
  Filled 2018-08-23 (×2): qty 30

## 2018-08-23 MED ORDER — LOSARTAN POTASSIUM 50 MG PO TABS
100.0000 mg | ORAL_TABLET | Freq: Every day | ORAL | Status: DC
  Administered 2018-08-23 – 2018-08-24 (×2): 100 mg via ORAL
  Filled 2018-08-23 (×2): qty 2

## 2018-08-23 MED ORDER — POTASSIUM CHLORIDE CRYS ER 20 MEQ PO TBCR
40.0000 meq | EXTENDED_RELEASE_TABLET | Freq: Once | ORAL | Status: AC
  Administered 2018-08-23: 40 meq via ORAL
  Filled 2018-08-23: qty 2

## 2018-08-23 MED ORDER — METOPROLOL TARTRATE 25 MG PO TABS: 25.0000 mg | ORAL_TABLET | Freq: Two times a day (BID) | ORAL | Status: DC

## 2018-08-23 MED ORDER — AMIODARONE HCL 200 MG PO TABS
200.0000 mg | ORAL_TABLET | Freq: Every day | ORAL | Status: DC
  Administered 2018-08-23 – 2018-08-24 (×2): 200 mg via ORAL
  Filled 2018-08-23 (×2): qty 1

## 2018-08-23 MED ORDER — LOSARTAN POTASSIUM 50 MG PO TABS: 50.0000 mg | ORAL_TABLET | Freq: Every day | ORAL | Status: DC

## 2018-08-23 NOTE — Progress Notes (Signed)
   Subjective: Stacy Byrd was seen and evaluated at bedside. No acute events overnight. This morning she complains of abdominal pain and loose stools beginning last night. She denies any chest pain or shortness of breath.   Objective:  Vital signs in last 24 hours: Vitals:   08/23/18 0645 08/23/18 0936 08/23/18 1034 08/23/18 1456  BP: (!) 176/77  (!) 192/76   Pulse: (!) 56  79   Resp: 17   18  Temp: 98.4 F (36.9 C)   98.5 F (36.9 C)  TempSrc: Oral   Oral  SpO2: 100% 100%    Weight: 33.8 kg     Height:       General: awake, alert, lying in bed in NAD CV: RRR; no murmurs Pulm: normal respiratory effort; lungs CTA bilaterally   Assessment/Plan:  Active Problems:   Hypertension   COPD   ACS (acute coronary syndrome) (HCC)   NSTEMI (non-ST elevated myocardial infarction) (HCC)   Paroxysmal atrial fibrillation (HCC)   Arterial hypotension  1. NSTEMI - likely secondary to diffuse CAD with demand ischemia in the setting of afib with RVR  - appreciate cardiology following - continuing medical management with heparin for another day - initiated Plavix which she will continue indefinitely - on high intensity statin - echo showed EF of 65-70%; normal wall motion; Grade 1 diastolic dysfunction   2. Paroxysmal Afib - currently in NSR; started on low dose Amiodarone to maintain normal rhythm - resumed home bisoprolol-HCTZ and discontinued Metop - not a candidate for long-term anticoagulation   3. Hypoglycemia - may have been inaccurate in the setting of hypotension - no hypoglycemic events since admission   4. Chronic hypoxic respiratory failure due to COPD - on 2L at home; respiratory status is stable; continue O2 supplementation PRN  - Discussed with Dr. Bradd Burner, patient only takes Proair per her preference and has not been compliant with other therapies.   5. Macrocytic anemia 2/2 B12 deficiency - started on Y09 and folic acid   Dispo: Patient will be medically stable  for discharge to SNF placement.   Modena Nunnery D, DO 08/23/2018, 3:14 PM Pager: [phone]

## 2018-08-23 NOTE — Clinical Social Work Note (Signed)
Clinical Social Work Assessment  Patient Details  Name: Stacy Byrd MRN: [id number] Date of Birth: 1931/01/11  Date of referral:  08/23/18               Reason for consult:  Facility Placement, Discharge Planning                Permission sought to share information with:  Facility Sport and exercise psychologist, Family Supports Permission granted to share information::  Yes, Verbal Permission Granted  Name::     Donnelly Stager  Agency::  SNFs, [facility]  Relationship::  daughter  Contact Information:  [phone]  Housing/Transportation Living arrangements for the past 2 months:  Single Family Home Source of Information:  Adult Children Patient Interpreter Needed:  None Criminal Activity/Legal Involvement Pertinent to Current Situation/Hospitalization:  No - Comment as needed Significant Relationships:  Adult Children, Other Family Members Lives with:  Self Do you feel safe going back to the place where you live?  Yes Need for family participation in patient care:  Yes (Comment)  Care giving concerns: Patient from home. PT recommending SNF. Patient is a Pharmacist, hospital.   Social Worker assessment / plan: CSW met with patient and family at bedside. Patient did greet CSW, but lethargic and RN working with patient during assessment. CSW introduced self and role and discussed disposition planning. Family would like for patient to go to SNF for rehab. They are worried about her taking care of herself at home. They state she cannot take care of herself and she hasn't been eating. They would prefer [facility].  Did discuss with patient's [facility] worker, [facility] and sent clinicals to P[facility]. PACE will approve a rehab stay for patient. CSW sent initial SNF referrals. [facility] has offered a bed and can accept patient when medically ready.  CSW to follow and support with discharge planning.  Employment status:  Retired Forensic scientist:  Other (Comment Required)([facility]) PT Recommendations:  [place] / Referral to community resources:  [place]  Patient/Family's Response to care: Patient and family appreciative of care.  Patient/Family's Understanding of and Emotional Response to Diagnosis, Current Treatment, and Prognosis: Patient's family with good understanding of patient's condition and agreeable to SNF.  Emotional Assessment Appearance:  Appears stated age Attitude/Demeanor/Rapport:  Lethargic Affect (typically observed):  Calm Orientation:  Oriented to Self, Oriented to Place, Oriented to  Time Alcohol / Substance use:  Not Applicable Psych involvement (Current and /or in the community):  No (Comment)  Discharge Needs  Concerns to be addressed:  Discharge Planning Concerns, Care Coordination Readmission within the last 30 days:  No Current discharge risk:  Physical Impairment, Lives alone Barriers to Discharge:  Continued Medical Work up   Estanislado Emms, LCSW 08/23/2018, 4:33 PM

## 2018-08-23 NOTE — Progress Notes (Signed)
Progress Note  Patient Name: Stacy Byrd Date of Encounter: 08/23/2018  Primary Cardiologist: No primary care provider on file. New ([place]) Subjective   No further angina.  Remains in sinus rhythm with frequent PACs and occasional PVCs.  Denies problems with dyspnea in bed or when she gets up to use the bedside commode (at least no change from her chronic dyspnea complaints).  No orthopnea.  Inpatient Medications    Scheduled Meds: . aspirin EC  81 mg Oral Daily  . atorvastatin  80 mg Oral q1800  . clopidogrel  75 mg Oral Daily  . feeding supplement  237 mL Oral TID BM  . folic acid  1 mg Oral Daily  . losartan  100 mg Oral Daily  . Melatonin  3 mg Oral QHS  . metoprolol tartrate  12.5 mg Oral BID  . pantoprazole  40 mg Oral BID  . umeclidinium-vilanterol  1 puff Inhalation Daily  . vitamin B-12  1,000 mcg Oral Daily   Continuous Infusions: . sodium chloride 10 mL/hr (08/22/18 1245)  . heparin 700 Units/hr (08/23/18 0651)   PRN Meds: acetaminophen **OR** acetaminophen, albuterol, alum & mag hydroxide-simeth, menthol-cetylpyridinium, promethazine, senna-docusate   Vital Signs    Vitals:   08/22/18 2110 08/23/18 0645 08/23/18 0936 08/23/18 1034  BP: (!) 189/87 (!) 176/77  (!) 192/76  Pulse: (!) 58 (!) 56  79  Resp: 19 17    Temp:  98.4 F (36.9 C)    TempSrc:  Oral    SpO2: 100% 100% 100%   Weight:  33.8 kg    Height:        Intake/Output Summary (Last 24 hours) at 08/23/2018 1155 Last data filed at 08/23/2018 0851 Gross per 24 hour  Intake 522 ml  Output 300 ml  Net 222 ml   Last 3 Weights 08/23/2018 08/22/2018 08/21/2018  Weight (lbs) 74 lb 9.6 oz 76 lb 1.6 oz 80 lb  Weight (kg) 33.838 kg 34.519 kg 36.288 kg      Telemetry    Sinus rhythm with frequent PACs and occasional PVCs, no atrial fibrillation- Personally Reviewed  ECG    Rhythm first-degree AV block, LVH with secondary repolarization abnormalities, marked improvement in the spread ST  segment deviation that was seen on previous tracing.- Personally Reviewed  Physical Exam  Cachectic, appears very frail, very hard of hearing GEN: No acute distress.   Neck: No JVD Cardiac: RRR with occasional ectopy, no murmurs, rubs, or gallops.  Respiratory: Clear to auscultation bilaterally. GI: Soft, nontender, non-distended  MS: No edema; No deformity. Neuro:  Nonfocal  Psych: Normal affect   Labs    Chemistry Recent Labs  Lab 08/21/18 1523 08/22/18 0637 08/23/18 0415  NA 145 145 143  K 4.1 3.5 3.5  CL 100 107 105  CO2 28 28 31   GLUCOSE 77 69* 95  BUN 28* 25* 19  CREATININE 1.32* 0.99 0.97  CALCIUM 9.9 8.6* 8.6*  PROT 6.1*  --   --   ALBUMIN 3.8  --   --   AST 23  --   --   ALT 8  --   --   ALKPHOS 39  --   --   BILITOT 1.2  --   --   GFRNONAA 37* 52* 53*  GFRAA 43* >60 >60  ANIONGAP 17* 10 7     Hematology Recent Labs  Lab 08/21/18 1523 08/22/18 0334 08/23/18 0415  WBC 11.0* 9.1 9.6  RBC 3.66* 3.07* 3.26*  HGB 11.1* 9.1* 9.7*  HCT 37.2 30.8* 32.0*  MCV 101.6* 100.3* 98.2  MCH 30.3 29.6 29.8  MCHC 29.8* 29.5* 30.3  RDW 14.1 14.1 14.0  PLT 244 185 183    Cardiac Enzymes Recent Labs  Lab 08/22/18 0334 08/22/18 0637 08/22/18 1322 08/22/18 1928  TROPONINI 1.30* 1.49* 1.22* 0.97*    Recent Labs  Lab 08/21/18 1829  TROPIPOC 0.03     BNPNo results for input(s): BNP, PROBNP in the last 168 hours.   DDimer No results for input(s): DDIMER in the last 168 hours.   Radiology    No results found.  Cardiac Studies   Echo August 21, 2018 - Left ventricle: The cavity size was normal. Wall thickness was   increased in a pattern of mild LVH. Systolic function was   vigorous. The estimated ejection fraction was in the range of 65%   to 70%. Wall motion was normal; there were no regional wall   motion abnormalities. Doppler parameters are consistent with   abnormal left ventricular relaxation (grade 1 diastolic   dysfunction). The E/e&'  ratio is between 8-15, suggesting   indeterminate LV filing pressure. - Mitral valve: Mildly thickened leaflets . There was trivial   regurgitation. - Left atrium: Severely dilated. - Right atrium: The atrium was normal in size. - Tricuspid valve: There was mild regurgitation. - Pulmonary arteries: PA peak pressure: 40 mm Hg (S). - Inferior vena cava: The vessel was normal in size. The   respirophasic diameter changes were in the normal range (>= 50%),   consistent with normal central venous pressure.  Impressions:  - LVEF 65-70%, mild LVH, normal wall motion, grade 1 DD,   indeterminate LV filling pressure, trivial MR, severe LAE, normal   RA size, mild TR, RVSP 40 mmg, normal IVC.   Patient Profile     85 y.o. female with suspected multivessel CAD presenting with angina at rest and marked ischemic ECG changes, mild elevation in troponin during first documented episode of atrial fibrillation with rapid ventricular response  Assessment & Plan    1. NSTEMI: most likely demand ischemia in the setting of previously undiagnosed LM stenosis or multivessel CAD, triggered by AF w RVR. Cannot exclude a true acute coronary event, but agree that she would be a poor revascularization candidate. Statin started. On heparin IV, discontinue 72 hours after presentation. Start clopidogrel (she is not a CABG candidate). BP is high, will restart her home prescription for bisoprolol hydrochlorothiazide and continue losartan.  Medical therapy is preferable in view of poor overall functional and nutritional status.  Neither the patient nor the family wish invasive evaluation or surgery. 2. AFIB: on IV heparin.  Will plan low-dose amiodarone for prevention.  She is not a good candidate for long-term anticoagulation and definitely not a candidate for combined antiplatelet and anticoagulant therapy.  Discussed the risk of stroke with the patient and her daughter.  Their priority is quality of life and symptom  relief. 3. Anemia: macrocytic. Atypical lymphs seen, consider bone marrow disorder.  Labile compared to yesterday 4. COPD:   This is the major functional limitation. 5. HLP: started atorvastatin. LDL target <70.  Consider discharge tomorrow.     For questions or updates, please contact [place] Please consult [email or web address] for contact info under        Signed, Sanda Klein, MD  08/23/2018, 11:55 AM

## 2018-08-23 NOTE — Progress Notes (Signed)
Physical Therapy Treatment Patient Details Name: Stacy Byrd MRN: [id number] DOB: Feb 18, 1931 Today's Date: 08/23/2018    History of Present Illness Pt is an 85 y.o. female admitted 08/21/18 with NSTEMI. PMH includes COPD (2L O2 home), CKD, HTN, DM, anxiety, depression.   PT Comments    Pt slowly progressing with mobility. Able to stand and take steps to recliner with HHA and minA to maintain balance. Pt declining further mobility secondary to nausea and fatigue secondary to worsening diarrhea. SpO2 97% on 2L O2 [place]. Continue to recommend SNF-level therapies.     Follow Up Recommendations  SNF;Supervision/Assistance - 24 hour     Equipment Recommendations  None recommended by PT    Recommendations for Other Services       Precautions / Restrictions Precautions Precautions: Fall Precaution Comments: Baseline 2L home O2 Restrictions Weight Bearing Restrictions: No    Mobility  Bed Mobility Overal bed mobility: Modified Independent             General bed mobility comments: HOB slightly elevated  Transfers Overall transfer level: Needs assistance Equipment used: 1 person hand held assist Transfers: Sit to/from Stand Sit to Stand: Min assist         General transfer comment: MinA and HHA to maintain balance upon standing  Ambulation/Gait Ambulation/Gait assistance: Min assist Gait Distance (Feet): 2 Feet Assistive device: 1 person hand held assist Gait Pattern/deviations: Step-to pattern;Trunk flexed Gait velocity: Decreased   General Gait Details: MinA and HHA to prevent LOB when taking steps from bed to recliner; pt declining further mobility secondary to nausea and fatigue   Stairs             Wheelchair Mobility    Modified Rankin (Stroke Patients Only)       Balance Overall balance assessment: Needs assistance   Sitting balance-Leahy Scale: Good       Standing balance-Leahy Scale: Poor Standing balance comment: Reliant on UE  support                            Cognition Arousal/Alertness: Awake/alert Behavior During Therapy: Flat affect Overall Cognitive Status: Within Functional Limits for tasks assessed                                 General Comments: [facility] for basic tasks. Pt lethargic and HOH      Exercises      General Comments General comments (skin integrity, edema, etc.): Daughter present and supportive. SpO2 97% on 2L O2 [place]      Pertinent Vitals/Pain Pain Assessment: Faces Faces Pain Scale: Hurts a little bit Pain Location: Stomach  Pain Descriptors / Indicators: Discomfort Pain Intervention(s): Limited activity within patient's tolerance    Home Living                      Prior Function            PT Goals (current goals can now be found in the care plan section) Acute Rehab PT Goals Patient Stated Goal: to go home PT Goal Formulation: With patient Time For Goal Achievement: 09/05/18 Potential to Achieve Goals: Fair Progress towards PT goals: Progressing toward goals    Frequency    Min 2X/week      PT Plan Frequency needs to be updated    Co-evaluation  AM-PAC PT "6 Clicks" Mobility   Outcome Measure  Help needed turning from your back to your side while in a flat bed without using bedrails?: None Help needed moving from lying on your back to sitting on the side of a flat bed without using bedrails?: None Help needed moving to and from a bed to a chair (including a wheelchair)?: A Little Help needed standing up from a chair using your arms (e.g., wheelchair or bedside chair)?: A Little Help needed to walk in hospital room?: A Little Help needed climbing 3-5 steps with a railing? : A Lot 6 Click Score: 19    End of Session Equipment Utilized During Treatment: Oxygen Activity Tolerance: Patient limited by fatigue Patient left: in chair;with call bell/phone within reach;with family/visitor present Nurse  Communication: Mobility status PT Visit Diagnosis: Unsteadiness on feet (R26.81);Muscle weakness (generalized) (M62.81)     Time: 2641-5830 PT Time Calculation (min) (ACUTE ONLY): 11 min  Charges:  $Therapeutic Activity: 8-22 mins                    Mabeline Caras, PT, DPT Acute Rehabilitation Services  Pager [phone] Office [place] 08/23/2018, 4:53 PM

## 2018-08-23 NOTE — Progress Notes (Signed)
ANTICOAGULATION CONSULT NOTE - Follow-up Consult  Pharmacy Consult:  Heparin Indication: chest pain/ACS  No Known Allergies  Patient Measurements: Height: 5\' 4"  (162.6 cm) Weight: 74 lb 9.6 oz (33.8 kg) IBW/kg (Calculated) : 54.7 Heparin Dosing Weight: 36.3kg  Vital Signs: Temp: 98.4 F (36.9 C) (01/14 0645) Temp Source: Oral (01/14 0645) BP: 192/76 (01/14 1034) Pulse Rate: 79 (01/14 1034)  Labs: Recent Labs    08/21/18 1523  08/22/18 0334 08/22/18 0637 08/22/18 1322 08/22/18 1928 08/23/18 0415 08/23/18 1259  HGB 11.1*  --  9.1*  --   --   --  9.7*  --   HCT 37.2  --  30.8*  --   --   --  32.0*  --   PLT 244  --  185  --   --   --  183  --   APTT 27  --   --   --   --   --   --   --   LABPROT 13.2  --   --   --   --   --   --   --   INR 1.01  --   --   --   --   --   --   --   HEPARINUNFRC  --    < > 0.13*  --  0.38  --  0.29* 0.68  CREATININE 1.32*  --   --  0.99  --   --  0.97  --   TROPONINI  --    < > 1.30* 1.49* 1.22* 0.97*  --   --    < > = values in this interval not displayed.    Estimated Creatinine Clearance: 22.6 mL/min (by C-G formula based on SCr of 0.97 mg/dL).  Assessment: 56 YOF initially presented as a Code STEMI to continue on IV heparin for NSTEMI.  Plans noted to continue for 72 hours (noted with afib but not an anticoagulation candidate) -HL= 0.68   Goal of Therapy:  Heparin level 0.3-0.7 units/ml Monitor platelets by anticoagulation protocol: Yes   Plan:  -No heparin changes needed -Daily heparin level and CBC -Anticipate d/c heparin on 1/15  Hildred Laser, PharmD Clinical Pharmacist **Pharmacist phone directory can now be found on [email or web address] ([email or web address]).  Listed under [place].   e

## 2018-08-23 NOTE — NC FL2 (Signed)
[place] MEDICAID FL2 LEVEL OF CARE SCREENING TOOL     IDENTIFICATION  Patient Name: Stacy Byrd Birthdate: 22-Jun-1931 Sex: female Admission Date (Current Location): 08/21/2018  [facility] and [place] Number:  Herbalist and Address:  [facility] [place] [address], [place], [place] [address]      Provider Number: [id number]  Attending Physician Name and Address:  Lucious Groves, DO  Relative Name and Phone Number:  [name], daughter, [phone]    Current Level of Care: Hospital Recommended Level of Care: [place] Prior Approval Number:    Date Approved/Denied:   PASRR Number: [id number]  Discharge Plan: SNF    Current Diagnoses: Patient Active Problem List   Diagnosis Date Noted  . NSTEMI (non-ST elevated myocardial infarction) ([place])   . Paroxysmal atrial fibrillation (HCC)   . Arterial hypotension   . ACS (acute coronary syndrome) ([place]) 08/21/2018  . Encounter for long-term (current) use of other medications 11/30/2013  . Anemia 11/25/2013  . Nausea & vomiting, abdominal pain 11/25/2013  . Unspecified protein-calorie malnutrition ([place]) 06/19/2013  . Hypertension   . Hyperlipidemia   . COPD   . Anxiety   . Depression   . Prediabetes   . Vitamin D deficiency   . Insomnia   . Hypertensive CKD (chronic kidney disease)     Orientation RESPIRATION BLADDER Height & Weight        O2(Nasal canula 2L/min) Continent Weight: 33.8 kg Height:  5\' 4"  (162.6 cm)  BEHAVIORAL SYMPTOMS/MOOD NEUROLOGICAL BOWEL NUTRITION STATUS      Continent Diet(please see DC summary)  AMBULATORY STATUS COMMUNICATION OF NEEDS Skin   Limited Assist Verbally Normal                       Personal Care Assistance Level of Assistance  Bathing, Feeding, Dressing Bathing Assistance: Limited assistance Feeding assistance: Independent Dressing Assistance: Limited assistance     Functional Limitations Info  Sight, Hearing,  Speech Sight Info: Adequate Hearing Info: Impaired Speech Info: Adequate    SPECIAL CARE FACTORS FREQUENCY  PT (By licensed PT)     PT Frequency: 5x/weekly              Contractures Contractures Info: Not present    Additional Factors Info  Code Status, Allergies Code Status Info: DNR Allergies Info: No Known Allergies           Current Medications (08/23/2018):  This is the current hospital active medication list Current Facility-Administered Medications  Medication Dose Route Frequency Provider Last Rate Last Dose  . 0.9 %  sodium chloride infusion   Intravenous Continuous Carmin Muskrat, MD 10 mL/hr at 08/22/18 1245 10 mL/hr at 08/22/18 1245  . acetaminophen (TYLENOL) tablet 650 mg  650 mg Oral Q6H PRN Chundi, Vahini, MD       Or  . acetaminophen (TYLENOL) suppository 650 mg  650 mg Rectal Q6H PRN Chundi, Vahini, MD      . albuterol (PROVENTIL) (2.5 MG/3ML) 0.083% nebulizer solution 3 mL  3 mL Inhalation Q4H PRN Chundi, Vahini, MD      . alum & mag hydroxide-simeth (MAALOX/MYLANTA) 200-200-20 MG/5ML suspension 30 mL  30 mL Oral PRN Joni Reining C, DO   30 mL at 08/23/18 0109  . amiodarone (PACERONE) tablet 200 mg  200 mg Oral Daily Croitoru, Mihai, MD   200 mg at 08/23/18 1217  . aspirin EC tablet 81 mg  81 mg Oral Daily  Lars Mage, MD   81 mg at 08/23/18 1031  . atorvastatin (LIPITOR) tablet 80 mg  80 mg Oral q1800 Chundi, Verne Spurr, MD   80 mg at 08/22/18 1922  . bisoprolol-hydrochlorothiazide (ZIAC) 10-6.25 MG per tablet 1 tablet  1 tablet Oral Daily Croitoru, Mihai, MD      . clopidogrel (PLAVIX) tablet 75 mg  75 mg Oral Daily [place], Carley D, DO   75 mg at 08/23/18 1030  . feeding supplement (BOOST / RESOURCE BREEZE) liquid 1 Container  237 mL Oral TID BM Chundi, Vahini, MD   1 Container at 08/23/18 1031  . folic acid (FOLVITE) tablet 1 mg  1 mg Oral Daily Chundi, Vahini, MD   1 mg at 08/23/18 1030  . heparin ADULT infusion 100 units/mL (25000 units/239mL  sodium chloride 0.45%)  700 Units/hr Intravenous Continuous Franky Macho, RPH 7 mL/hr at 08/23/18 0651 700 Units/hr at 08/23/18 0651  . losartan (COZAAR) tablet 100 mg  100 mg Oral Daily [place], Carley D, DO   100 mg at 08/23/18 1033  . Melatonin TABS 3 mg  3 mg Oral QHS Chundi, Vahini, MD   3 mg at 08/22/18 2124  . menthol-cetylpyridinium (CEPACOL) lozenge 3 mg  1 lozenge Oral PRN Joni Reining C, DO      . pantoprazole (PROTONIX) EC tablet 40 mg  40 mg Oral BID Chundi, Vahini, MD   40 mg at 08/23/18 1031  . promethazine (PHENERGAN) tablet 12.5 mg  12.5 mg Oral Q6H PRN Chundi, Vahini, MD   12.5 mg at 08/23/18 1218  . senna-docusate (Senokot-S) tablet 1 tablet  1 tablet Oral QHS PRN Chundi, Vahini, MD      . umeclidinium-vilanterol (ANORO ELLIPTA) 62.5-25 MCG/INH 1 puff  1 puff Inhalation Daily [place], Carley D, DO   1 puff at 08/23/18 0936  . vitamin B-12 (CYANOCOBALAMIN) tablet 1,000 mcg  1,000 mcg Oral Daily Chundi, Verne Spurr, MD   1,000 mcg at 08/23/18 1030     Discharge Medications: Please see discharge summary for a list of discharge medications.  Relevant Imaging Results:  Relevant Lab Results:   Additional Information SSN: [id number]  Estanislado Emms, LCSW

## 2018-08-23 NOTE — Progress Notes (Signed)
MD on call paged via amion regarding SBP 180's. Pt resting quietly without complaints at this time. Will continue to monitor and await return call/orders. Jessie Foot, RN

## 2018-08-23 NOTE — Progress Notes (Signed)
ANTICOAGULATION CONSULT NOTE - Follow-up Consult  Pharmacy Consult:  Heparin Indication: chest pain/ACS  No Known Allergies  Patient Measurements: Height: 5\' 4"  (162.6 cm) Weight: 76 lb 1.6 oz (34.5 kg) IBW/kg (Calculated) : 54.7 Heparin Dosing Weight: 36.3kg  Vital Signs: Temp: 98.6 F (37 C) (01/13 2109) Temp Source: Oral (01/13 2109) BP: 189/87 (01/13 2110) Pulse Rate: 58 (01/13 2110)  Labs: Recent Labs    08/21/18 1523  08/22/18 0334 08/22/18 0637 08/22/18 1322 08/22/18 1928 08/23/18 0415  HGB 11.1*  --  9.1*  --   --   --  9.7*  HCT 37.2  --  30.8*  --   --   --  32.0*  PLT 244  --  185  --   --   --  183  APTT 27  --   --   --   --   --   --   LABPROT 13.2  --   --   --   --   --   --   INR 1.01  --   --   --   --   --   --   HEPARINUNFRC  --   --  0.13*  --  0.38  --  0.29*  CREATININE 1.32*  --   --  0.99  --   --  0.97  TROPONINI  --    < > 1.30* 1.49* 1.22* 0.97*  --    < > = values in this interval not displayed.    Estimated Creatinine Clearance: 23.1 mL/min (by C-G formula based on SCr of 0.97 mg/dL).  Assessment: 19 YOF initially presented as a Code STEMI to continue on IV heparin for NSTEMI.  Heparin level down to slightly subtherapeutic this morning (0.29) on gtt at 600 units/hr. No issues with line or bleeding reported per RN.   Goal of Therapy:  Heparin level 0.3-0.7 units/ml Monitor platelets by anticoagulation protocol: Yes   Plan:  Increase heparin gtt to 700 units/hr F/u 8 hr heparin level   Sherlon Handing, PharmD, BCPS Clinical pharmacist  **Pharmacist phone directory can now be found on [email or web address] ([email or web address]).  Listed under [place]. 08/23/2018, 5:13 AM

## 2018-08-24 DIAGNOSIS — Z7902 Long term (current) use of antithrombotics/antiplatelets: Secondary | ICD-10-CM

## 2018-08-24 LAB — CBC
HCT: 29.8 % — ABNORMAL LOW (ref 36.0–46.0)
Hemoglobin: 9.4 g/dL — ABNORMAL LOW (ref 12.0–15.0)
MCH: 31 pg (ref 26.0–34.0)
MCHC: 31.5 g/dL (ref 30.0–36.0)
MCV: 98.3 fL (ref 80.0–100.0)
Platelets: 172 10*3/uL (ref 150–400)
RBC: 3.03 MIL/uL — ABNORMAL LOW (ref 3.87–5.11)
RDW: 14.2 % (ref 11.5–15.5)
WBC: 8.1 10*3/uL (ref 4.0–10.5)
nRBC: 0 % (ref 0.0–0.2)

## 2018-08-24 LAB — HEPARIN LEVEL (UNFRACTIONATED): Heparin Unfractionated: 0.57 IU/mL (ref 0.30–0.70)

## 2018-08-24 LAB — GLUCOSE, CAPILLARY: Glucose-Capillary: 79 mg/dL (ref 70–99)

## 2018-08-24 MED ORDER — AMIODARONE HCL 200 MG PO TABS: 200.0000 mg | ORAL_TABLET | Freq: Every day | ORAL | 0 refills | Status: DC

## 2018-08-24 MED ORDER — CLOPIDOGREL BISULFATE 75 MG PO TABS: 75.0000 mg | ORAL_TABLET | Freq: Every day | ORAL | 0 refills | Status: AC

## 2018-08-24 MED ORDER — ROSUVASTATIN CALCIUM 20 MG PO TABS: 20.0000 mg | ORAL_TABLET | Freq: Every day | ORAL | Status: DC

## 2018-08-24 MED ORDER — FOLIC ACID 1 MG PO TABS: 1.0000 mg | ORAL_TABLET | Freq: Every day | ORAL | 0 refills | Status: AC

## 2018-08-24 MED ORDER — ENOXAPARIN SODIUM 300 MG/3ML IJ SOLN
20.0000 mg | INTRAMUSCULAR | Status: DC
  Administered 2018-08-24: 20 mg via SUBCUTANEOUS
  Filled 2018-08-24: qty 0.2

## 2018-08-24 MED ORDER — ROSUVASTATIN CALCIUM 20 MG PO TABS: 20.0000 mg | ORAL_TABLET | Freq: Every day | ORAL | 0 refills | Status: AC

## 2018-08-24 MED ORDER — CYANOCOBALAMIN 1000 MCG PO TABS: 1000.0000 ug | ORAL_TABLET | Freq: Every day | ORAL | 0 refills | Status: AC

## 2018-08-24 MED ORDER — BISOPROLOL-HYDROCHLOROTHIAZIDE 10-6.25 MG PO TABS
0.5000 | ORAL_TABLET | Freq: Every day | ORAL | Status: DC
  Administered 2018-08-24: 0.5 via ORAL
  Filled 2018-08-24: qty 1

## 2018-08-24 NOTE — Progress Notes (Signed)
   Subjective: Stacy Byrd was seen and evaluated at bedside. No acute events overnight. She denies any chest pain or shortness of breath. Complains of some abdominal discomfort and loose stools. No nausea or vomiting. Discussed plan for going to rehab prior to returning home which she and family are in agreement with.   Objective:  Vital signs in last 24 hours: Vitals:   08/23/18 1456 08/23/18 1948 08/24/18 0437 08/24/18 0907  BP:  136/68 (!) 156/82   Pulse:  (!) 51 (!) 49   Resp: 18     Temp: 98.5 F (36.9 C) 98 F (36.7 C) 98.4 F (36.9 C)   TempSrc: Oral Oral Oral   SpO2:  100% 100% 100%  Weight:   33.8 kg   Height:       General: lying in bed in NAD CV: RRR; no murmurs, rubs or gallops Abd: Soft, mildly tender throughout, non-distended Ext: no edema   Assessment/Plan:  Active Problems:   Hypertension   COPD   ACS (acute coronary syndrome) (HCC)   NSTEMI (non-ST elevated myocardial infarction) (HCC)   Paroxysmal atrial fibrillation (HCC)   Arterial hypotension  1. NSTEMI - likely secondary to diffuse CAD with demand ischemia in the setting of afib with RVR  - appreciate cardiology following - medically managed with heparin which will be discontinued today - initiated Plavix which she will continue indefinitely - patient had some muscle cramping since starting atorvastatin 80; will transition to rosuvastatin 20 mg  - echo showed EF of 65-70%; normal wall motion; Grade 1 diastolic dysfunction   2.ParoxysmalAfib - currently in NSR; started on Amiodarone 200 mg which will likely be tapered down to 100 mg at follow-up for long-term management  - resumed home bisoprolol-HCTZ and discontinued Metop - not a candidate for long-term anticoagulation   3. Hypoglycemia - may have been inaccurate in the setting of hypotension - no hypoglycemic events since admission   4. Chronic hypoxic respiratory failure due to COPD - on 2L at home; respiratory status is stable;  continue O2 supplementation PRN  - Discussed with Dr. Bradd Burner, patient only takes Proair per her preference and has not been compliant with other therapies.   5. Macrocytic anemia 2/2 B12 deficiency - started on O16 and folic acid   Dispo: Patient medically stable for discharge to SNF today.   Modena Nunnery D, DO 08/24/2018, 11:41 AM Pager: [phone]

## 2018-08-24 NOTE — Clinical Social Work Placement (Signed)
   CLINICAL SOCIAL WORK PLACEMENT  NOTE  Date:  08/24/2018  Patient Details  Name: Stacy Byrd MRN: [id number] Date of Birth: 1931-02-19  Clinical Social Work is seeking post-discharge placement for this patient at the [place] level of care (*CSW will initial, date and re-position this form in  chart as items are completed):  Yes   Patient/family provided with [facility] Work Department's list of facilities offering this level of care within the geographic area requested by the patient (or if unable, by the patient's family).  Yes   Patient/family informed of their freedom to choose among providers that offer the needed level of care, that participate in Medicare, Medicaid or managed care program needed by the patient, have an available bed and are willing to accept the patient.  Yes   Patient/family informed of [facility]'s ownership interest in [facility] and [facility], as well as of the fact that they are under no obligation to receive care at these facilities.  PASRR submitted to EDS on       PASRR number received on       Existing PASRR number confirmed on       FL2 transmitted to all facilities in geographic area requested by pt/family on       FL2 transmitted to all facilities within larger geographic area on       Patient informed that his/her managed care company has contracts with or will negotiate with certain facilities, including the following:  [facility]     Yes   Patient/family informed of bed offers received.  Patient chooses bed at [facility] recommends and patient chooses bed at      Patient to be transferred to [facility] on 08/24/18.  Patient to be transferred to facility by [facility]     Patient family notified on 08/24/18 of transfer.  Name of family member notified:  [name], daughter     PHYSICIAN Please prepare priority discharge summary,  including medications, Please sign DNR, Please prepare prescriptions     Additional Comment:    _______________________________________________ Estanislado Emms, LCSW 08/24/2018, 12:39 PM

## 2018-08-24 NOTE — Social Work (Signed)
Patient will discharge to [facility]. Anticipated discharge date: 08/24/18 Family notified: [name], daughter; and other family at bedside Transportation by: [facility] - pickup scheduled for 3 pm  Nurse to call report to [phone].  CSW signing off.  Estanislado Emms, [facility]  Clinical Social Worker

## 2018-08-24 NOTE — Discharge Summary (Signed)
Name: Stacy Byrd MRN: [id number] DOB: 13-Feb-1931 85 y.o. PCP: Janifer Adie, MD  Date of Admission: 08/21/2018  6:18 PM Date of Discharge: 08/24/2018 Attending Physician: Lucious Groves, DO  Discharge Diagnosis: 1. NSTEMI 2. Paroxysmal afib 3. Macocytic anemia 2/2 B12 deficiency   Discharge Medications: Allergies as of 08/24/2018   No Known Allergies     Medication List    STOP taking these medications   acetaminophen 325 MG tablet Commonly known as:  TYLENOL   Fluticasone-Salmeterol 250-50 MCG/DOSE Aepb Commonly known as:  ADVAIR DISKUS   hyoscyamine 0.125 MG tablet Commonly known as:  LEVSIN, ANASPAZ   ondansetron 4 MG disintegrating tablet Commonly known as:  ZOFRAN-ODT   ondansetron 4 MG tablet Commonly known as:  ZOFRAN   pravastatin 40 MG tablet Commonly known as:  PRAVACHOL   prochlorperazine 5 MG tablet Commonly known as:  COMPAZINE   tiotropium 18 MCG inhalation capsule Commonly known as:  SPIRIVA HANDIHALER     TAKE these medications   acetaminophen-codeine 300-30 MG tablet Commonly known as:  TYLENOL #3 Take 1 tablet by mouth 2 (two) times daily. For arthritis   AEROCHAMBER PLUS FLO-VU LARGE Misc 1 each by Other route once.   albuterol 108 (90 Base) MCG/ACT inhaler Commonly known as:  PROVENTIL HFA;VENTOLIN HFA Inhale 2 puffs into the lungs every 4 (four) hours as needed for wheezing. What changed:  when to take this   amiodarone 200 MG tablet Commonly known as:  PACERONE Take 1 tablet (200 mg total) by mouth daily for 30 days. Start taking on:  August 25, 2018   aspirin 81 MG EC tablet Take 1 tablet (81 mg total) by mouth daily.   benzonatate 100 MG capsule Commonly known as:  TESSALON TAKE ONE CAPSULE BY MOUTH 3 TIMES A DAY AS NEEDED FOR COUGH What changed:  See the new instructions.   bisoprolol-hydrochlorothiazide 10-6.25 MG tablet Commonly known as:  ZIAC TAKE 1 TABLET EVERY MORNING What changed:    when to take  this  additional instructions   Calcium Carbonate Antacid 600 MG chewable tablet Chew 600 mg by mouth 2 (two) times daily. For bone strength   clopidogrel 75 MG tablet Commonly known as:  PLAVIX Take 1 tablet (75 mg total) by mouth daily. Start taking on:  August 25, 2018   cyanocobalamin 1000 MCG tablet Take 1 tablet (1,000 mcg total) by mouth daily. Start taking on:  August 25, 2018   ENSURE PLUS Liqd Take 237 mLs by mouth 3 (three) times daily between meals.   folic acid 1 MG tablet Commonly known as:  FOLVITE Take 1 tablet (1 mg total) by mouth daily. Start taking on:  August 25, 2018   losartan 100 MG tablet Commonly known as:  COZAAR Take 1 tablet (100 mg total) by mouth daily.   Melatonin 3 MG Tabs Take 3 mg by mouth at bedtime.   pantoprazole 40 MG tablet Commonly known as:  PROTONIX Take 1 tablet (40 mg total) by mouth daily. What changed:    when to take this  additional instructions   promethazine 12.5 MG tablet Commonly known as:  PHENERGAN Take 1 tablet (12.5 mg total) by mouth every 6 (six) hours as needed for nausea or vomiting.   rosuvastatin 20 MG tablet Commonly known as:  CRESTOR Take 1 tablet (20 mg total) by mouth daily at 6 PM.   SENNA S 8.6-50 MG tablet Generic drug:  senna-docusate Take 2 tablets by mouth at  bedtime. For constipation   Vitamin D3 50 MCG (2000 UT) Tabs Take 2,000 Units by mouth daily.       Disposition and follow-up:   Ms.Shemicka F Dombrosky was discharged from [facility] in Stable condition.  At the hospital follow up visit please address:  1.  NSTEMI: medically managed with heparin for 48 hours. Started on Plavix which she will continue indefinitely. Statin therapy changed to Rosavastatin 20 mg. If she develops muscle cramping, can switch her back to original Pravastatin.  Paroxysmal afib: was in RVR on presentation. Converted to normal sinus with IV amiodarone. She was started on Amiodarone 200  mg. Rates well controlled on home bisoprolol-HCTZ. She is not a candidate for long-term anticoagulation.  Please arrange for cardiology follow-up once she is discharged from rehab.  Referral was placed to outpatient palliative care if patient and family want extra support with GOC or symptom management at home.  Macrocytic anemia secondary to B12 deficiency: started on N82 and folic acid supplementation   2.  Labs / imaging needed at time of follow-up: none  3.  Pending labs/ test needing follow-up: none   Follow-up Appointments: Contact information for after-discharge care    Destination    [facility] .   Service:  Skilled Nursing Contact information: [address] Hospital Course by problem list: Ms. Nawrot is a pleasant 85 yo female with a.fib, copd, pre-diabetes, gerd, hld, htn, arthritis, breast cancer s/p left mastectomy who presented with acute onset  substernal chest pain. She was hypotensive on arrival and found to be in afib with RVR with work-up consistent with NSTEMI.   1. NSTEMI: likely secondary to diffuse CAD with demand ischemia, as patient was hypotensive on presentation and was found to be in afib with RVR. Due to multiple comorbidities and overall functional status, decision was made by patient and family for medical management with IV heparin. She was initiated on Plavix to continue indefinitely. She was initially started on atorvastatin 80 mg, but switched to rosuvastatin 20 mg due to muscle cramping. Echo was done which showed EF of 65-70%, normal wall motion, Grade 1 diastolic dysfunction.   2. Paroxysmal afib: Initially in RVR on arrival. Patient converted to NSR with IV amiodarone. Cardiology transitioned her to PO Amiodarone 200 mg which will likely be tapered down to 100 mg at follow-up. She maintained NSR throughout admission. Rates were well controlled on home  bisoprolol-HCTZ. She is not a candidate for long-term anticoagulation due to fragility and high fall risk.   3. Hypoglycemia: found to have CBG of 44 in ED. This may have been inaccurate in the setting of hypotension. She did not have any further hypoglycemic events during hospitalization.   4. Macrocytic anemia 2/2 B12 deficiency: She was initiated on Z30 and folic acid.    Discharge Vitals:   BP (!) 156/82 (BP Location: Right Arm)   Pulse (!) 49   Temp 98.4 F (36.9 C) (Oral)   Resp 18   Ht 5\' 4"  (1.626 m)   Wt 33.8 kg   SpO2 100%   BMI 12.80 kg/m   Pertinent Labs, Studies, and Procedures:  TTE Study Conclusions  - Left ventricle: The cavity size was normal. Wall thickness was   increased in a pattern of mild LVH. Systolic function was   vigorous. The estimated ejection fraction was in the range  of 65%   to 70%. Wall motion was normal; there were no regional wall   motion abnormalities. Doppler parameters are consistent with   abnormal left ventricular relaxation (grade 1 diastolic   dysfunction). The E/e&' ratio is between 8-15, suggesting   indeterminate LV filing pressure. - Mitral valve: Mildly thickened leaflets . There was trivial   regurgitation. - Left atrium: Severely dilated. - Right atrium: The atrium was normal in size. - Tricuspid valve: There was mild regurgitation. - Pulmonary arteries: PA peak pressure: 40 mm Hg (S). - Inferior vena cava: The vessel was normal in size. The   respirophasic diameter changes were in the normal range (>= 50%),   consistent with normal central venous pressure.  Discharge Instructions: Discharge Instructions    Amb Referral to Palliative Care   Complete by:  As directed    Diet - low sodium heart healthy   Complete by:  As directed    Discharge instructions   Complete by:  As directed    Ms. Durkee, it was a pleasure taking care of you. You were treated in the hospital for a heart attack which we managed with medications.  You will continue to take these when you leave the hospital. I think rehab will help build your strength back up so you can do better at home. Please follow up with your heart doctor within the next month once you leave rehab.  I wish you all the best! Dr. Koleen Distance   Increase activity slowly   Complete by:  As directed       Signed: Delice Bison, DO 08/24/2018, 1:48 PM   Pager: [phone]

## 2018-08-24 NOTE — Care Management Important Message (Signed)
Important Message  Patient Details  Name: Stacy Byrd MRN: [id number] Date of Birth: 11-22-1930   Medicare Important Message Given:  Yes    Samhitha Rosen P Tanganika Barradas 08/24/2018, 3:12 PM

## 2018-08-24 NOTE — Progress Notes (Signed)
Progress Note  Patient Name: Stacy Byrd Date of Encounter: 08/24/2018  Primary Cardiologist: No primary care provider on file.   Subjective   Has had some issues with nausea, loose stools, abdominal discomfort, but no cardiovascular complaints. Has bradycardia 45-49 bpm.  Blood pressure improved.  Inpatient Medications    Scheduled Meds: . amiodarone  200 mg Oral Daily  . aspirin EC  81 mg Oral Daily  . bisoprolol-hydrochlorothiazide  1 tablet Oral Daily  . clopidogrel  75 mg Oral Daily  . enoxaparin (LOVENOX) injection  20 mg Subcutaneous Q24H  . feeding supplement  237 mL Oral TID BM  . folic acid  1 mg Oral Daily  . losartan  100 mg Oral Daily  . Melatonin  3 mg Oral QHS  . pantoprazole  40 mg Oral BID  . rosuvastatin  20 mg Oral q1800  . umeclidinium-vilanterol  1 puff Inhalation Daily  . vitamin B-12  1,000 mcg Oral Daily   Continuous Infusions: . sodium chloride 10 mL/hr (08/22/18 1245)   PRN Meds: acetaminophen **OR** acetaminophen, albuterol, alum & mag hydroxide-simeth, menthol-cetylpyridinium, promethazine, senna-docusate   Vital Signs    Vitals:   08/23/18 1357 08/23/18 1456 08/23/18 1948 08/24/18 0437  BP: (!) 149/72  136/68 (!) 156/82  Pulse: (!) 59  (!) 51 (!) 49  Resp:  18    Temp:  98.5 F (36.9 C) 98 F (36.7 C) 98.4 F (36.9 C)  TempSrc:  Oral Oral Oral  SpO2: 100%  100% 100%  Weight:    33.8 kg  Height:        Intake/Output Summary (Last 24 hours) at 08/24/2018 0958 Last data filed at 08/24/2018 0944 Gross per 24 hour  Intake 240 ml  Output -  Net 240 ml   Last 3 Weights 08/24/2018 08/23/2018 08/22/2018  Weight (lbs) 74 lb 9.6 oz 74 lb 9.6 oz 76 lb 1.6 oz  Weight (kg) 33.837 kg 33.838 kg 34.519 kg      Telemetry    Sinus bradycardia, occasional PVCs, occasional PACs, no atrial fibrillation- Personally Reviewed  ECG    No new tracings- Personally Reviewed  Physical Exam  Cachectic, lying fully supine in bed GEN: No acute  distress.   Neck: No JVD Cardiac: RRR, bradycardia ,no murmurs, rubs, or gallops.  Respiratory: Clear to auscultation bilaterally. GI: Soft, nontender, non-distended  MS: No edema; No deformity. Neuro:  Nonfocal.  Very hard of hearing Psych: Normal affect   Labs    Chemistry Recent Labs  Lab 08/21/18 1523 08/22/18 0637 08/23/18 0415  NA 145 145 143  K 4.1 3.5 3.5  CL 100 107 105  CO2 28 28 31   GLUCOSE 77 69* 95  BUN 28* 25* 19  CREATININE 1.32* 0.99 0.97  CALCIUM 9.9 8.6* 8.6*  PROT 6.1*  --   --   ALBUMIN 3.8  --   --   AST 23  --   --   ALT 8  --   --   ALKPHOS 39  --   --   BILITOT 1.2  --   --   GFRNONAA 37* 52* 53*  GFRAA 43* >60 >60  ANIONGAP 17* 10 7     Hematology Recent Labs  Lab 08/22/18 0334 08/23/18 0415 08/24/18 0408  WBC 9.1 9.6 8.1  RBC 3.07* 3.26* 3.03*  HGB 9.1* 9.7* 9.4*  HCT 30.8* 32.0* 29.8*  MCV 100.3* 98.2 98.3  MCH 29.6 29.8 31.0  MCHC 29.5* 30.3 31.5  RDW 14.1 14.0 14.2  PLT 185 183 172    Cardiac Enzymes Recent Labs  Lab 08/22/18 0334 08/22/18 0637 08/22/18 1322 08/22/18 1928  TROPONINI 1.30* 1.49* 1.22* 0.97*    Recent Labs  Lab 08/21/18 1829  TROPIPOC 0.03     BNPNo results for input(s): BNP, PROBNP in the last 168 hours.   DDimer No results for input(s): DDIMER in the last 168 hours.   Radiology    No results found.  Cardiac Studies   Echo August 21, 2018 - Left ventricle: The cavity size was normal. Wall thickness was increased in a pattern of mild LVH. Systolic function was vigorous. The estimated ejection fraction was in the range of 65% to 70%. Wall motion was normal; there were no regional wall motion abnormalities. Doppler parameters are consistent with abnormal left ventricular relaxation (grade 1 diastolic dysfunction). The E/e&' ratio is between 8-15, suggesting indeterminate LV filing pressure. - Mitral valve: Mildly thickened leaflets . There was trivial regurgitation. -  Left atrium: Severely dilated. - Right atrium: The atrium was normal in size. - Tricuspid valve: There was mild regurgitation. - Pulmonary arteries: PA peak pressure: 40 mm Hg (S). - Inferior vena cava: The vessel was normal in size. The respirophasic diameter changes were in the normal range (>= 50%), consistent with normal central venous pressure.  Impressions:  - LVEF 65-70%, mild LVH, normal wall motion, grade 1 DD, indeterminate LV filling pressure, trivial MR, severe LAE, normal RA size, mild TR, RVSP 40 mmg, normal IVC.   Patient Profile     85 y.o. female with suspected multivessel CAD presenting with angina at rest and marked ischemic ECG changes, mild elevation in troponin during first documented episode of atrial fibrillation with rapid ventricular response  Assessment & Plan    1. NSTEMI:most likely demand ischemia in the setting of previously undiagnosed LM stenosis or multivessel CAD, triggered by AF w RVR. Cannot exclude a true acute coronary event, but agree that she would be a poor revascularization candidate. Statin started. On heparin IV, discontinue 72 hours after presentation. Start clopidogrel (she is not a CABG candidate). BP is high, will restart her home prescription for bisoprolol hydrochlorothiazide and continue losartan.  Medical therapy is preferable in view of poor overall functional and nutritional status.  Neither the patient nor the family wish invasive evaluation or surgery. 2. AFIB: Reduce the dose of bisoprolol due to sinus bradycardia. Will plan low-dose amiodarone for prevention.   In view of her very low body mass, 200 mg daily is probably a loading dose for her and with plan long-term treatment with 100 mg daily. She is not a good candidate for long-term anticoagulation.  Discussed the risk of stroke with the patient and her daughter.  Their priority is quality of life and symptom relief. 3. Anemia: macrocytic. Atypical lymphs seen, consider  bone marrow disorder.    Stable hemoglobin. 4. COPD:  This is the major functional limitation. 5. HLP: started atorvastatin. LDL target <70.  [facility] will sign off.   Medication Recommendations: Amiodarone 200 mg once daily (plan to reduce to 100 mg daily in 1 month if no recurrent atrial fibrillation), bisoprolol-hydrochlorothiazide 10/6.2 5/2 tablet daily, aspirin 81 mg daily, clopidogrel 75 mg daily, rosuvastatin 20 mg daily Other recommendations (labs, testing, etc): Repeat liver function tests, lipid profile, TSH in 3 months and then every 6 months. Follow up as an outpatient: Plan is for her to go to rehab.  Please make arrangements for follow-up in  our office after discharge from rehab, preferably within the next 30 days.  For questions or updates, please contact Bernie Please consult [email or web address] for contact info under        Signed, Sanda Klein, MD  08/24/2018, 9:58 AM

## 2018-08-24 NOTE — Progress Notes (Signed)
Spoke with Dr. Koleen Distance concerning pt's nausea. Phenergan not ordered at discharge. Dr. Koleen Distance stated she would take care of it. Cont to monitor. Carroll Kinds RN

## 2018-08-30 ENCOUNTER — Non-Acute Institutional Stay: Payer: Medicaid Other | Admitting: Primary Care

## 2018-09-01 ENCOUNTER — Non-Acute Institutional Stay: Payer: Medicaid Other | Admitting: Primary Care

## 2018-09-01 DIAGNOSIS — Z515 Encounter for palliative care: Secondary | ICD-10-CM

## 2018-09-01 NOTE — Progress Notes (Signed)
Community Palliative Care Telephone: [phone] Fax: [phone]  PATIENT NAME: Stacy Byrd DOB: 1930-08-14 MRN: [id number]  PRIMARY CARE PROVIDER:   Janifer Adie, MD  REFERRING PROVIDER:  Janifer Byrd, [place], [place] [address]  RESPONSIBLE PARTY:    Extended Emergency Contact Information Primary Emergency Contact: Stacy Byrd of [place] Mobile Phone: [phone] Relation: Daughter Secondary Emergency Contact: Stacy Byrd Home Phone: [phone] Relation: Son  ASSESSMENT and RECOMMENDATIONS:   Met with patient at [facility] facility. Patient stated she was very tired and ready to return to skilled facility. Observed to be very ill appearing, cachectic. She was using oxygen. She appeared alert and oriented x 3. Met further with staff at [facility] to discuss the plan of care for patient. Recent history includes her living in her own home alone with support from [facility] in the community. Patient suffered a NSTEMI  several weeks ago and is recuperating now with rehab at [facility] and staying at [facility] skilled facility for ADL support. Palliative care was called to address goals of care and symptom management.  1. Unsteady gait R26.81 secondary to progression of COPD, general weakness. Continue fall precautions, high-risk. Encourage therapy for balance training. Encourage home safety assessment. Functionality is very limited due to debility, dyspnea and protein calorie malnutrition. Would require much assistance with ADLs and I ADLs. Her son in a later phone conversation stated that she was driving until recently.   2. Anorexia R63.0. Support/ encourage to continue to eat meals. Continue supplements, snacks and weights. Ongoing and long-standing problem as described by [facility] staff nutritionist. Patient does have a poor appetite and has not enjoyed the meals at the nursing home per her son. She is currently 78 pounds per report of the clinic. Malnutrition  may be palliated with supplements of protein shakes or protein powder smoothies if she were open to that.   3. Dyspneic R06.00 secondary to COPD remain stable at present time. Continue oxygen use. Could recommend nebulizers with triple drug therapy: however, compliance seems to be an issue with many of the previously attempted interventions goals of care. Some question as to whether she is currently smoking; She is Oxygen dependent.   4. Palliative care encounter Z51.5; Palliative medicine team will continue to support patient, patient's family, and medical team. Goals of care needs MOST form, POA created per [employer] Had a discussion with son by phone about  Health care power of attorney. Explained how this would provide her with the ability to delegate her medical decisions to the person of her choice. Encouraged her on the phone and him as well to discuss this matter again. Son states he has paperwork drawn up but the patient stated she wanted to wait until she was home to sign it. Again reiterated value in having this done since one never knows when it might be needed; in addition, it needed to be notarized. Son voiced understanding.   Palliative care to continue  to follow for goals of care clarification, symptom management recommendations. We will continue to discuss goals of care and symptom management on Monday 09/05/2018 at 1130 am appointment.  I spent 55 minutes providing this consultation,  from 1400 to 1455. More than 50% of the time in this consultation was spent coordinating communication.   HISTORY OF PRESENT ILLNESS:  Stacy Byrd is a 85 y.o. year old female with multiple medical problems including CAD, COPD, depression / anxiety, protein calorie malnutrition, HTN,h/o breast cancer. Palliative  Care was asked to help address goals of care.   CODE STATUS: DNR  PPS: 30% HOSPICE ELIGIBILITY/DIAGNOSIS: TBD  PAST MEDICAL HISTORY:  Past Medical History:  Diagnosis Date  . Anxiety    . Arthritis    RA ALL OVER "  . Breast cancer ([place])   . COPD (chronic obstructive pulmonary disease) ([place])   . Depression   . Diabetes mellitus without complication ([place])   . GERD (gastroesophageal reflux disease)   . Hyperlipidemia   . Hypertension   . Hypertensive CKD (chronic kidney disease)   . Insomnia   . Prediabetes   . Shortness of breath   . Unspecified protein-calorie malnutrition ([place]) 06/19/2013  . Vitamin D deficiency     SOCIAL HX:  Social History   Tobacco Use  . Smoking status: Current Every Day Smoker    Packs/day: 0.50    Years: 65.00    Pack years: 32.50    Types: Cigarettes  . Smokeless tobacco: Never Used  Substance Use Topics  . Alcohol use: No    ALLERGIES: No Known Allergies   PERTINENT MEDICATIONS:  Outpatient Encounter Medications as of 09/01/2018  Medication Sig  . acetaminophen-codeine (TYLENOL #3) 300-30 MG tablet Take 1 tablet by mouth 2 (two) times daily. For arthritis  . albuterol (PROVENTIL HFA;VENTOLIN HFA) 108 (90 BASE) MCG/ACT inhaler Inhale 2 puffs into the lungs every 4 (four) hours as needed for wheezing. (Patient taking differently: Inhale 2 puffs into the lungs 2 (two) times daily. )  . amiodarone (PACERONE) 200 MG tablet Take 1 tablet (200 mg total) by mouth daily for 30 days.  Marland Kitchen aspirin EC 81 MG EC tablet Take 1 tablet (81 mg total) by mouth daily. (Patient not taking: Reported on 08/21/2018)  . benzonatate (TESSALON) 100 MG capsule TAKE ONE CAPSULE BY MOUTH 3 TIMES A DAY AS NEEDED FOR COUGH (Patient taking differently: Take 100 mg by mouth 3 (three) times daily as needed for cough. )  . bisoprolol-hydrochlorothiazide (ZIAC) 10-6.25 MG per tablet TAKE 1 TABLET EVERY MORNING (Patient taking differently: Take 1 tablet by mouth daily. For high blood pressure)  . Calcium Carbonate Antacid 600 MG chewable tablet Chew 600 mg by mouth 2 (two) times daily. For bone strength  . Cholecalciferol (VITAMIN D3) 50 MCG (2000 UT) TABS Take 2,000  Units by mouth daily.  . clopidogrel (PLAVIX) 75 MG tablet Take 1 tablet (75 mg total) by mouth daily.  Marland Kitchen ENSURE PLUS (ENSURE PLUS) LIQD Take 237 mLs by mouth 3 (three) times daily between meals.  . folic acid (FOLVITE) 1 MG tablet Take 1 tablet (1 mg total) by mouth daily.  Marland Kitchen losartan (COZAAR) 100 MG tablet Take 1 tablet (100 mg total) by mouth daily. (Patient not taking: Reported on 08/21/2018)  . Melatonin 3 MG TABS Take 3 mg by mouth at bedtime.  . pantoprazole (PROTONIX) 40 MG tablet Take 1 tablet (40 mg total) by mouth daily. (Patient taking differently: Take 40 mg by mouth 2 (two) times daily. For heartburn)  . promethazine (PHENERGAN) 12.5 MG tablet Take 1 tablet (12.5 mg total) by mouth every 6 (six) hours as needed for nausea or vomiting. (Patient not taking: Reported on 08/21/2018)  . rosuvastatin (CRESTOR) 20 MG tablet Take 1 tablet (20 mg total) by mouth daily at 6 PM.  . senna-docusate (SENNA S) 8.6-50 MG tablet Take 2 tablets by mouth at bedtime. For constipation  . Spacer/Aero-Holding Chambers (AEROCHAMBER PLUS FLO-VU LARGE) MISC 1 each by Other route once.  Marland Kitchen  vitamin B-12 1000 MCG tablet Take 1 tablet (1,000 mcg total) by mouth daily.   No facility-administered encounter medications on file as of 09/01/2018.     PHYSICAL EXAM:  Deferred  Cyndia Skeeters DNP, AGPCNP-BC

## 2018-09-07 ENCOUNTER — Non-Acute Institutional Stay: Payer: Medicaid Other | Admitting: Primary Care

## 2018-09-07 DIAGNOSIS — I214 Non-ST elevation (NSTEMI) myocardial infarction: Secondary | ICD-10-CM

## 2018-09-07 DIAGNOSIS — Z515 Encounter for palliative care: Secondary | ICD-10-CM

## 2018-09-07 DIAGNOSIS — J4489 Other specified chronic obstructive pulmonary disease: Secondary | ICD-10-CM

## 2018-09-07 DIAGNOSIS — J449 Chronic obstructive pulmonary disease, unspecified: Secondary | ICD-10-CM

## 2018-09-07 NOTE — Progress Notes (Signed)
Community Palliative Care Telephone: [phone] Fax: [phone]  PATIENT NAME: Stacy Byrd DOB: 1931-02-02 MRN: [id number]  PRIMARY CARE PROVIDER:   Janifer Adie, MD  REFERRING PROVIDER:  Janifer Adie, [place], [place] [address]  RESPONSIBLE PARTY:   Extended Emergency Contact Information Primary Emergency Contact: [name] Mobile Phone: [phone] Relation: Daughter Secondary Emergency Contact: [name] Home Phone: [phone] Relation: Son   ASSESSMENT and RECOMMENDATIONS:   1. Dysnea-  No s/sx URI, Uses 2 l oxygen , Denies smoking.Endorses baseline dyspnea. Appears well supported in SNF.  2. Falls Denies, walks in room without devices. Debility after MI, here for PT, support while she increases function. Walked to bathroom and back to bed independently.   3. Nutrition;  Eats most of meals, sometimes half . Uses ensure supplements, prefers strawberry, vanilla on over bed table not opened. States she's gained  3 pounds.  4. Sleep: Endorses insomnia, Recommend melatonin 3 mg.  Sleeps in bed but usually does not rest well.  5. Caregiving: Daughter from [place] will come and stay with her mother.Patient states she is happy with this arrangement.   6. Goals of Care; Pt very eager to return to home. Son not present in SNF today. Discussed POA with him by phone at last visit.  Palliative care to continue  to follow for goals of care clarification, symptom management recommendations. Return 2-4 weeks.  I spent 25 minutes providing this consultation,  from 1600 to 1625. More than 50% of the time in this consultation was spent coordinating communication.   HISTORY OF PRESENT ILLNESS:  Stacy Byrd is a 85 y.o. year old female with multiple medical problems including CAD, COPD, depression / anxiety, protein calorie malnutrition, HTN,h/o breast cancer.. Palliative Care was asked to help address goals of care.    CODE STATUS: DNR  PPS: 40% HOSPICE ELIGIBILITY/DIAGNOSIS: yes/ COPD or protein calorie malnutrion.  PAST MEDICAL HISTORY:  Past Medical History:  Diagnosis Date  . Anxiety   . Arthritis    RA ALL OVER "  . Breast cancer ([place])   . COPD (chronic obstructive pulmonary disease) ([place])   . Depression   . Diabetes mellitus without complication ([place])   . GERD (gastroesophageal reflux disease)   . Hyperlipidemia   . Hypertension   . Hypertensive CKD (chronic kidney disease)   . Insomnia   . Prediabetes   . Shortness of breath   . Unspecified protein-calorie malnutrition ([place]) 06/19/2013  . Vitamin D deficiency     SOCIAL HX:  Social History   Tobacco Use  . Smoking status: Current Every Day Smoker    Packs/day: 0.50    Years: 65.00    Byrd years: 32.50    Types: Cigarettes  . Smokeless tobacco: Never Used  Substance Use Topics  . Alcohol use: No    ALLERGIES: No Known Allergies   PERTINENT MEDICATIONS:  Outpatient Encounter Medications as of 09/07/2018  Medication Sig  . acetaminophen-codeine (TYLENOL #3) 300-30 MG tablet Take 1 tablet by mouth 2 (two) times daily. For arthritis  . albuterol (PROVENTIL HFA;VENTOLIN HFA) 108 (90 BASE) MCG/ACT inhaler Inhale 2 puffs into the lungs every 4 (four) hours as needed for wheezing. (Patient taking differently: Inhale 2 puffs into the lungs 2 (two) times daily. )  . amiodarone (PACERONE) 200 MG tablet Take 1 tablet (200 mg total) by mouth daily for 30 days.  Marland Kitchen aspirin EC 81 MG EC tablet Take 1  tablet (81 mg total) by mouth daily. (Patient not taking: Reported on 08/21/2018)  . benzonatate (TESSALON) 100 MG capsule TAKE ONE CAPSULE BY MOUTH 3 TIMES A DAY AS NEEDED FOR COUGH (Patient taking differently: Take 100 mg by mouth 3 (three) times daily as needed for cough. )  . bisoprolol-hydrochlorothiazide (ZIAC) 10-6.25 MG per tablet TAKE 1 TABLET EVERY MORNING (Patient taking differently: Take 1 tablet by mouth daily. For high blood  pressure)  . Calcium Carbonate Antacid 600 MG chewable tablet Chew 600 mg by mouth 2 (two) times daily. For bone strength  . Cholecalciferol (VITAMIN D3) 50 MCG (2000 UT) TABS Take 2,000 Units by mouth daily.  . clopidogrel (PLAVIX) 75 MG tablet Take 1 tablet (75 mg total) by mouth daily.  Marland Kitchen ENSURE PLUS (ENSURE PLUS) LIQD Take 237 mLs by mouth 3 (three) times daily between meals.  . folic acid (FOLVITE) 1 MG tablet Take 1 tablet (1 mg total) by mouth daily.  Marland Kitchen losartan (COZAAR) 100 MG tablet Take 1 tablet (100 mg total) by mouth daily. (Patient not taking: Reported on 08/21/2018)  . Melatonin 3 MG TABS Take 3 mg by mouth at bedtime.  . pantoprazole (PROTONIX) 40 MG tablet Take 1 tablet (40 mg total) by mouth daily. (Patient taking differently: Take 40 mg by mouth 2 (two) times daily. For heartburn)  . promethazine (PHENERGAN) 12.5 MG tablet Take 1 tablet (12.5 mg total) by mouth every 6 (six) hours as needed for nausea or vomiting. (Patient not taking: Reported on 08/21/2018)  . rosuvastatin (CRESTOR) 20 MG tablet Take 1 tablet (20 mg total) by mouth daily at 6 PM.  . senna-docusate (SENNA S) 8.6-50 MG tablet Take 2 tablets by mouth at bedtime. For constipation  . Spacer/Aero-Holding Chambers (AEROCHAMBER PLUS FLO-VU LARGE) MISC 1 each by Other route once.  . vitamin B-12 1000 MCG tablet Take 1 tablet (1,000 mcg total) by mouth daily.   No facility-administered encounter medications on file as of 09/07/2018.     PHYSICAL EXAM:  Arm circ 20 cm Temp 97.2 89/66 66 HR, Resp 24. Wt 81.4 lb  General: NAD, frail appearing, thin Cardiovascular: irregular rate and rhythm, S1S2 Pulmonary: poor air movement all  fields, nebulizers prn, oxygen dependent Abdomen: soft, nontender, + bowel sounds Extremities: no edema, no joint deformities Skin: no rashes Neurological: Weakness, appears to be a good historian at this interview  [place], AGPCNP-BC

## 2018-09-15 ENCOUNTER — Ambulatory Visit (INDEPENDENT_AMBULATORY_CARE_PROVIDER_SITE_OTHER): Payer: Medicare (Managed Care) | Admitting: Physician Assistant

## 2018-09-15 ENCOUNTER — Encounter: Payer: Self-pay | Admitting: Physician Assistant

## 2018-09-15 VITALS — BP 128/74 | HR 42 | Ht 63.0 in | Wt 82.0 lb

## 2018-09-15 DIAGNOSIS — R7303 Prediabetes: Secondary | ICD-10-CM

## 2018-09-15 DIAGNOSIS — I214 Non-ST elevation (NSTEMI) myocardial infarction: Secondary | ICD-10-CM

## 2018-09-15 DIAGNOSIS — I480 Paroxysmal atrial fibrillation: Secondary | ICD-10-CM

## 2018-09-15 DIAGNOSIS — R001 Bradycardia, unspecified: Secondary | ICD-10-CM

## 2018-09-15 DIAGNOSIS — I10 Essential (primary) hypertension: Secondary | ICD-10-CM

## 2018-09-15 DIAGNOSIS — D519 Vitamin B12 deficiency anemia, unspecified: Secondary | ICD-10-CM | POA: Diagnosis not present

## 2018-09-15 DIAGNOSIS — E785 Hyperlipidemia, unspecified: Secondary | ICD-10-CM

## 2018-09-15 MED ORDER — AMIODARONE HCL 200 MG PO TABS: 100.0000 mg | ORAL_TABLET | Freq: Every day | ORAL | 2 refills | Status: AC

## 2018-09-15 NOTE — Patient Instructions (Addendum)
Medication Instructions:   DECREASE AMIODARONE TO 100 MG DAILY  Your physician recommends that you continue on your current medications as directed. Please refer to the Current Medication list given to you today.  If you need a refill on your cardiac medications before your next appointment, please call your pharmacy.   Lab work: Today you will lab work done: CBC  Your physician recommends that you return for lab work in 3 months for LIVER Function, Lipid panel and a TSH blood work  If you have labs (blood work) drawn today and your tests are completely normal, you will receive your results only by: Marland Kitchen MyChart Message (if you have MyChart) OR . A paper copy in the mail If you have any lab test that is abnormal or we need to change your treatment, we will call you to review the results.  Testing/Procedures:  NONE  Follow-Up: At [facility], you and your health needs are our priority.  As part of our continuing mission to provide you with exceptional heart care, we have created designated Provider Care Teams.  These Care Teams include your primary Cardiologist (physician) and Advanced Practice Providers (APPs -  Physician Assistants and Nurse Practitioners) who all work together to provide you with the care you need, when you need it.  Your physician recommends that you schedule a follow-up appointment in 1 month with Stacy Deforest, PA-C and with Dr. Sallyanne Kuster in 2-3 months  Any Other Special Instructions Will Be Listed Below (If Applicable).

## 2018-09-15 NOTE — Progress Notes (Signed)
Cardiology Office Note    Date:  09/17/2018   ID:  Breckyn, Troyer 07-Jun-1931, MRN [id number]  PCP:  Janifer Adie, MD  Cardiologist:  Dr. Sallyanne Kuster  Chief Complaint  Patient presents with  . Follow-up    1 month. Seen for Dr. Sallyanne Kuster    History of Present Illness:  Stacy Byrd is a 85 y.o. female with PMH of HTN, HLD, prediabtes, and COPD.  She has no prior history of CAD.  She presented to the hospital on 08/21/2018 for possible STEMI.  Patient described sudden onset of chest pain.  EKG showed diffuse ST depression as well as 2 mm ST elevation in aVR as well as new atrial fibrillation.  It is unclear how long she has been in atrial fibrillation.  She was started on amiodarone.  She did have elevated troponin in the hospital, this was felt to be demand ischemia in the setting of previously undiagnosed left main disease or multivessel CAD triggered by A. fib with RVR. She was eventually diagnosed with NSTEMI and not STEMI. It was felt the patient is a very poor revascularization or long-term anticoagulation candidate.  She was started on Plavix, but no systemic anticoagulation was recommended due to high risk of bleeding and her frailty.  Increased stroke risk has been discussed with the patient and her daughter. The case was discussed with both the patient and her family, neither of which favor invasive study.  Therefore, medical therapy was pursued.  Echocardiogram obtained during the admission showed EF 65 to 70%, grade 1 DD, mild TR, PASP 40 mmHg.  She was discharged on amiodarone 200 mg daily with plan to reduce to 100 mg daily after 1 month if no recurrent atrial fibrillation.  The plan is to repeat liver function, lipid profile and TSH in 3 months then every 6 months afterward.  Due to the presence of microcytic anemia, she was started on vitamin P54 and folic acid supplement.  Patient presents today for follow-up by herself.  She is currently residing at [facility].  She denies  any recent chest pain or significant shortness of breath.  She is on 24/7 oxygen via nasal cannula.  Her heart rate on initial arrival was 58, however EKG shows severe sinus bradycardia with heart rate in the low 40s.  I will reduce her amiodarone to 100 mg daily.  Patient seems to be asymptomatic from the bradycardia due to her small size and lack of muscle mass.  I plan to bring the patient back in a few weeks for reassessment and potentially decrease the bisoprolol as well.  Otherwise, her condition is stable at this time.  She appears to be euvolemic on physical exam.  I plan to obtain CBC today to make sure her red blood cell count is stable on the aspirin and Plavix.  Otherwise she will need TSH, fasting lipid and liver function test in 46-month.   Past Medical History:  Diagnosis Date  . Anxiety   . Arthritis    RA ALL OVER "  . Breast cancer ([place])   . COPD (chronic obstructive pulmonary disease) ([place])   . Depression   . Diabetes mellitus without complication ([place])   . GERD (gastroesophageal reflux disease)   . Hyperlipidemia   . Hypertension   . Hypertensive CKD (chronic kidney disease)   . Insomnia   . Prediabetes   . Shortness of breath   . Unspecified protein-calorie malnutrition ([place]) 06/19/2013  . Vitamin D deficiency  Past Surgical History:  Procedure Laterality Date  . APPENDECTOMY    . BREAST SURGERY  1998    Current Medications: Outpatient Medications Prior to Visit  Medication Sig Dispense Refill  . acetaminophen-codeine (TYLENOL #3) 300-30 MG tablet Take 1 tablet by mouth 2 (two) times daily. For arthritis    . albuterol (PROVENTIL HFA;VENTOLIN HFA) 108 (90 BASE) MCG/ACT inhaler Inhale 2 puffs into the lungs every 4 (four) hours as needed for wheezing. (Patient taking differently: Inhale 2 puffs into the lungs 2 (two) times daily. ) 1 Inhaler 6  . aspirin EC 81 MG EC tablet Take 1 tablet (81 mg total) by mouth daily. 30 tablet 0  . benzonatate (TESSALON) 100 MG  capsule TAKE ONE CAPSULE BY MOUTH 3 TIMES A DAY AS NEEDED FOR COUGH (Patient taking differently: Take 100 mg by mouth 3 (three) times daily as needed for cough. ) 42 capsule 1  . bisoprolol-hydrochlorothiazide (ZIAC) 10-6.25 MG per tablet TAKE 1 TABLET EVERY MORNING (Patient taking differently: Take 1 tablet by mouth daily. For high blood pressure) 90 tablet 1  . Calcium Carbonate Antacid 600 MG chewable tablet Chew 600 mg by mouth 2 (two) times daily. For bone strength    . Cholecalciferol (VITAMIN D3) 50 MCG (2000 UT) TABS Take 2,000 Units by mouth daily.    . clopidogrel (PLAVIX) 75 MG tablet Take 1 tablet (75 mg total) by mouth daily. 30 tablet 0  . ENSURE PLUS (ENSURE PLUS) LIQD Take 237 mLs by mouth 3 (three) times daily between meals.    . folic acid (FOLVITE) 1 MG tablet Take 1 tablet (1 mg total) by mouth daily. 30 tablet 0  . Melatonin 3 MG TABS Take 3 mg by mouth at bedtime.    . pantoprazole (PROTONIX) 40 MG tablet Take 1 tablet (40 mg total) by mouth daily. (Patient taking differently: Take 40 mg by mouth 2 (two) times daily. For heartburn) 30 tablet 0  . promethazine (PHENERGAN) 12.5 MG tablet Take 1 tablet (12.5 mg total) by mouth every 6 (six) hours as needed for nausea or vomiting. 60 tablet 0  . rosuvastatin (CRESTOR) 20 MG tablet Take 1 tablet (20 mg total) by mouth daily at 6 PM. 30 tablet 0  . senna-docusate (SENNA S) 8.6-50 MG tablet Take 2 tablets by mouth at bedtime. For constipation    . Spacer/Aero-Holding Chambers (AEROCHAMBER PLUS FLO-VU LARGE) MISC 1 each by Other route once. 1 each 0  . vitamin B-12 1000 MCG tablet Take 1 tablet (1,000 mcg total) by mouth daily. 30 tablet 0  . amiodarone (PACERONE) 200 MG tablet Take 1 tablet (200 mg total) by mouth daily for 30 days. 30 tablet 0  . losartan (COZAAR) 100 MG tablet Take 1 tablet (100 mg total) by mouth daily. (Patient not taking: Reported on 08/21/2018) 90 tablet 1   No facility-administered medications prior to visit.        Allergies:   Patient has no known allergies.   Social History   Socioeconomic History  . Marital status: Widowed    Spouse name: Not on file  . Number of children: Not on file  . Years of education: Not on file  . Highest education level: Not on file  Occupational History  . Not on file  Social Needs  . Financial resource strain: Not on file  . Food insecurity:    Worry: Not on file    Inability: Not on file  . Transportation needs:    Medical:  Not on file    Non-medical: Not on file  Tobacco Use  . Smoking status: Current Every Day Smoker    Packs/day: 0.50    Years: 65.00    Pack years: 32.50    Types: Cigarettes  . Smokeless tobacco: Never Used  Substance and Sexual Activity  . Alcohol use: No  . Drug use: No  . Sexual activity: Not Currently  Lifestyle  . Physical activity:    Days per week: Not on file    Minutes per session: Not on file  . Stress: Not on file  Relationships  . Social connections:    Talks on phone: Not on file    Gets together: Not on file    Attends religious service: Not on file    Active member of club or organization: Not on file    Attends meetings of clubs or organizations: Not on file    Relationship status: Not on file  Other Topics Concern  . Not on file  Social History Narrative  . Not on file     Family History:  The patient's  family history includes Cancer in her daughter; Diabetes in her daughter; Heart disease in her daughter, father, and mother.   ROS:   Please see the history of present illness.    ROS All other systems reviewed and are negative.   PHYSICAL EXAM:   VS:  BP 128/74 (BP Location: Left Arm, Patient Position: Sitting, Cuff Size: Normal)   Pulse (!) 42   Ht 5\' 3"  (1.6 m)   Wt 82 lb (37.2 kg)   BMI 14.53 kg/m    GEN: Well nourished, well developed, in no acute distress  HEENT: normal  Neck: no JVD, carotid bruits, or masses Cardiac: RRR; no murmurs, rubs, or gallops,no edema  Respiratory:   clear to auscultation bilaterally, normal work of breathing GI: soft, nontender, nondistended, + BS MS: no deformity or atrophy  Skin: warm and dry, no rash Neuro:  Alert and Oriented x 3, Strength and sensation are intact Psych: euthymic mood, full affect  Wt Readings from Last 3 Encounters:  09/15/18 82 lb (37.2 kg)  08/24/18 74 lb 9.6 oz (33.8 kg)  11/30/13 96 lb (43.5 kg)      Studies/Labs Reviewed:   EKG:  EKG is ordered today.  The ekg ordered today demonstrates sinus bradycardia, heart rate 42  Recent Labs: 08/21/2018: ALT 8 08/23/2018: BUN 19; Creatinine, Ser 0.97; Potassium 3.5; Sodium 143 09/15/2018: Hemoglobin 9.0; Platelets 222   Lipid Panel    Component Value Date/Time   CHOL 250 (H) 08/21/2018 1523   TRIG 194 (H) 08/21/2018 1523   HDL 69 08/21/2018 1523   CHOLHDL 3.6 08/21/2018 1523   VLDL 39 08/21/2018 1523   LDLCALC 142 (H) 08/21/2018 1523    Additional studies/ records that were reviewed today include:   Echo 08/22/2018 LV EF: 65% -   70% Study Conclusions  - Left ventricle: The cavity size was normal. Wall thickness was   increased in a pattern of mild LVH. Systolic function was   vigorous. The estimated ejection fraction was in the range of 65%   to 70%. Wall motion was normal; there were no regional wall   motion abnormalities. Doppler parameters are consistent with   abnormal left ventricular relaxation (grade 1 diastolic   dysfunction). The E/e&' ratio is between 8-15, suggesting   indeterminate LV filing pressure. - Mitral valve: Mildly thickened leaflets . There was trivial   regurgitation. -  Left atrium: Severely dilated. - Right atrium: The atrium was normal in size. - Tricuspid valve: There was mild regurgitation. - Pulmonary arteries: PA peak pressure: 40 mm Hg (S). - Inferior vena cava: The vessel was normal in size. The   respirophasic diameter changes were in the normal range (>= 50%),   consistent with normal central venous  pressure.  Impressions:  - LVEF 65-70%, mild LVH, normal wall motion, grade 1 DD,   indeterminate LV filling pressure, trivial MR, severe LAE, normal   RA size, mild TR, RVSP 40 mmg, normal IVC.    ASSESSMENT:    1. NSTEMI (non-ST elevated myocardial infarction) ([place])   2. Paroxysmal atrial fibrillation ([place])   3. Bradycardia   4. Anemia due to vitamin B12 deficiency, unspecified B12 deficiency type   5. Essential hypertension   6. Hyperlipidemia LDL goal <70   7. Prediabetes      PLAN:  In order of problems listed above:  1. NSTEMI: Felt to be demand ischemia in the setting of underlying multivessel disease.  She has no prior history of CAD, however suspicion is high.  She was felt not to be revascularization candidate, therefore cardiac catheterization was not pursued.  Currently on aspirin and Plavix.  Obtain CBC to check for anemia  2. Paroxysmal atrial fibrillation: She is not a anticoagulation candidate given her frailty and the risk of bleeding.  On amiodarone, decrease amiodarone dose to 100 mg daily.  Will need TSH in 3 months.  3. Bradycardia: Currently maintaining sinus rhythm, however heart rate in the 40s.  Will decrease amiodarone to 100 mg daily.  Follow-up in 1 month for monitoring of heart rate  4. Hypertension: Blood pressure stable  5. Hyperlipidemia: Continue on Crestor.  Fasting lipid panel and LFT in 66-month  6. Prediabetes: Managed by primary care provider    Medication Adjustments/Labs and Tests Ordered: Current medicines are reviewed at length with the patient today.  Concerns regarding medicines are outlined above.  Medication changes, Labs and Tests ordered today are listed in the Patient Instructions below. Patient Instructions  Medication Instructions:   DECREASE AMIODARONE TO 100 MG DAILY  Your physician recommends that you continue on your current medications as directed. Please refer to the Current Medication list given to you  today.  If you need a refill on your cardiac medications before your next appointment, please call your pharmacy.   Lab work: Today you will lab work done: CBC  Your physician recommends that you return for lab work in 3 months for LIVER Function, Lipid panel and a TSH blood work  If you have labs (blood work) drawn today and your tests are completely normal, you will receive your results only by: Marland Kitchen MyChart Message (if you have MyChart) OR . A paper copy in the mail If you have any lab test that is abnormal or we need to change your treatment, we will call you to review the results.  Testing/Procedures:  NONE  Follow-Up: At [facility], you and your health needs are our priority.  As part of our continuing mission to provide you with exceptional heart care, we have created designated Provider Care Teams.  These Care Teams include your primary Cardiologist (physician) and Advanced Practice Providers (APPs -  Physician Assistants and Nurse Practitioners) who all work together to provide you with the care you need, when you need it.  Your physician recommends that you schedule a follow-up appointment in 1 month with Almyra Deforest, PA-C and with  Dr. Sallyanne Kuster in 2-3 months  Any Other Special Instructions Will Be Listed Below (If Applicable).        Hilbert Corrigan, [place]  09/17/2018 11:04 PM    [facility] [address], [place], [place]  [address] Phone: [phone]; Fax: [phone]

## 2018-09-16 LAB — CBC
Hematocrit: 27.3 % — ABNORMAL LOW (ref 34.0–46.6)
Hemoglobin: 9 g/dL — ABNORMAL LOW (ref 11.1–15.9)
MCH: 31 pg (ref 26.6–33.0)
MCHC: 33 g/dL (ref 31.5–35.7)
MCV: 94 fL (ref 79–97)
Platelets: 222 10*3/uL (ref 150–450)
RBC: 2.9 x10E6/uL — AB (ref 3.77–5.28)
RDW: 13 % (ref 11.7–15.4)
WBC: 5.4 10*3/uL (ref 3.4–10.8)

## 2018-09-16 NOTE — Progress Notes (Signed)
Red blood cell count mildly down, but largely stable. Given her risk of bleeding, would recommend recheck CBC in 3-4 weeks

## 2018-09-16 NOTE — Progress Notes (Signed)
Called and left a voice message for the patient

## 2018-09-17 ENCOUNTER — Encounter: Payer: Self-pay | Admitting: Physician Assistant

## 2018-09-19 NOTE — Progress Notes (Signed)
Called and spoke with daughter [name] and informed her of her mother's lab results  and verbalized understanding.  All questions (if any) were answered.

## 2018-10-14 ENCOUNTER — Encounter: Payer: Self-pay | Admitting: Physician Assistant

## 2018-10-14 ENCOUNTER — Other Ambulatory Visit: Payer: Self-pay

## 2018-10-14 ENCOUNTER — Ambulatory Visit (INDEPENDENT_AMBULATORY_CARE_PROVIDER_SITE_OTHER): Payer: Medicare (Managed Care) | Admitting: Physician Assistant

## 2018-10-14 VITALS — BP 143/62 | HR 50 | Ht 63.0 in | Wt 78.0 lb

## 2018-10-14 DIAGNOSIS — D649 Anemia, unspecified: Secondary | ICD-10-CM

## 2018-10-14 DIAGNOSIS — E785 Hyperlipidemia, unspecified: Secondary | ICD-10-CM

## 2018-10-14 DIAGNOSIS — I2510 Atherosclerotic heart disease of native coronary artery without angina pectoris: Secondary | ICD-10-CM

## 2018-10-14 DIAGNOSIS — I10 Essential (primary) hypertension: Secondary | ICD-10-CM

## 2018-10-14 DIAGNOSIS — D519 Vitamin B12 deficiency anemia, unspecified: Secondary | ICD-10-CM

## 2018-10-14 DIAGNOSIS — R001 Bradycardia, unspecified: Secondary | ICD-10-CM | POA: Diagnosis not present

## 2018-10-14 DIAGNOSIS — J449 Chronic obstructive pulmonary disease, unspecified: Secondary | ICD-10-CM

## 2018-10-14 DIAGNOSIS — I480 Paroxysmal atrial fibrillation: Secondary | ICD-10-CM

## 2018-10-14 DIAGNOSIS — I214 Non-ST elevation (NSTEMI) myocardial infarction: Secondary | ICD-10-CM

## 2018-10-14 DIAGNOSIS — R7303 Prediabetes: Secondary | ICD-10-CM

## 2018-10-14 LAB — CBC
Hematocrit: 27.2 % — ABNORMAL LOW (ref 34.0–46.6)
Hemoglobin: 8.9 g/dL — ABNORMAL LOW (ref 11.1–15.9)
MCH: 31.3 pg (ref 26.6–33.0)
MCHC: 32.7 g/dL (ref 31.5–35.7)
MCV: 96 fL (ref 79–97)
Platelets: 176 10*3/uL (ref 150–450)
RBC: 2.84 x10E6/uL — ABNORMAL LOW (ref 3.77–5.28)
RDW: 13.4 % (ref 11.7–15.4)
WBC: 6 10*3/uL (ref 3.4–10.8)

## 2018-10-14 NOTE — Progress Notes (Signed)
Cardiology Office Note    Date:  10/16/2018   ID:  Stacy Byrd 1930/08/19, MRN [id number]  PCP:  Janifer Adie, MD  Cardiologist:  Dr. Sallyanne Kuster   Chief Complaint  Patient presents with  . Follow-up    seen for Dr. Sallyanne Kuster.     History of Present Illness:  Stacy Byrd is a 85 y.o. female with PMH of HTN, HLD, prediabtes, COPD, atrial fibrillation and recently diagnosed CAD. She presented to the hospital on 08/21/2018 for possible STEMI.  Patient described sudden onset of chest pain.  EKG showed diffuse ST depression as well as 2 mm ST elevation in aVR as well as new atrial fibrillation.  It is unclear how long she has been in atrial fibrillation.  She was started on amiodarone.  She did have elevated troponin in the hospital, this was felt to be demand ischemia in the setting of previously undiagnosed left main disease or multivessel CAD triggered by A. fib with RVR. She was eventually diagnosed with NSTEMI and not STEMI. It was felt the patient is a very poor revascularization or long-term anticoagulation candidate.  She was started on Plavix, but no systemic anticoagulation was recommended due to high risk of bleeding and her frailty.  Increased stroke risk has been discussed with the patient and her daughter. The case was discussed with both the patient and her family, neither of which favor invasive study.  Therefore, medical therapy was pursued.  Echocardiogram obtained during the admission showed EF 65 to 70%, grade 1 DD, mild TR, PASP 40 mmHg.  She was discharged on amiodarone 200 mg daily with plan to reduce to 100 mg daily after 1 month if no recurrent atrial fibrillation.  The plan is to repeat liver function, lipid profile and TSH in 3 months then every 6 months afterward.  Due to the presence of microcytic anemia, she was started on vitamin O16 and folic acid supplement.  I last saw the patient on 09/15/2018, her EKG shows severe sinus bradycardia with heart rate in the low  40s.  I did reduce her amiodarone to 100 mg daily.  She was asymptomatic from the bradycardia due to her small size and lack of muscle mass.  Patient presents back for cardiology office visit.  Her heart rate has improved to 50 bpm.  She is doing everything by herself at home.  She has 4 children all lives in [place], 3 sons and 1 daughter.  They visit her on a daily basis.  She lives by herself and cook by herself.  Despite the fact that she is on 24/7 oxygen, she is able to do pretty much everything by herself at home without too much issue.  She denies any recent chest discomfort.  I will obtain CBC and a TSH.  She is due for fasting lipid panel and liver function test, however she is not fasting today.  Otherwise she appears to be euvolemic on physical exam.    Past Medical History:  Diagnosis Date  . Anxiety   . Arthritis    RA ALL OVER "  . Breast cancer ([place])   . COPD (chronic obstructive pulmonary disease) ([place])   . Depression   . Diabetes mellitus without complication ([place])   . GERD (gastroesophageal reflux disease)   . Hyperlipidemia   . Hypertension   . Hypertensive CKD (chronic kidney disease)   . Insomnia   . Prediabetes   . Shortness of breath   . Unspecified protein-calorie  malnutrition ([place]) 06/19/2013  . Vitamin D deficiency     Past Surgical History:  Procedure Laterality Date  . APPENDECTOMY    . BREAST SURGERY  1998    Current Medications: Outpatient Medications Prior to Visit  Medication Sig Dispense Refill  . acetaminophen-codeine (TYLENOL #3) 300-30 MG tablet Take 1 tablet by mouth 2 (two) times daily. For arthritis    . albuterol (PROVENTIL HFA;VENTOLIN HFA) 108 (90 BASE) MCG/ACT inhaler Inhale 2 puffs into the lungs every 4 (four) hours as needed for wheezing. (Patient taking differently: Inhale 2 puffs into the lungs 2 (two) times daily. ) 1 Inhaler 6  . amiodarone (PACERONE) 200 MG tablet Take 0.5 tablets (100 mg total) by mouth daily for 30 days. 15  tablet 2  . aspirin EC 81 MG EC tablet Take 1 tablet (81 mg total) by mouth daily. 30 tablet 0  . benzonatate (TESSALON) 100 MG capsule TAKE ONE CAPSULE BY MOUTH 3 TIMES A DAY AS NEEDED FOR COUGH (Patient taking differently: Take 100 mg by mouth 3 (three) times daily as needed for cough. ) 42 capsule 1  . bisoprolol-hydrochlorothiazide (ZIAC) 10-6.25 MG per tablet TAKE 1 TABLET EVERY MORNING (Patient taking differently: Take 1 tablet by mouth daily. For high blood pressure) 90 tablet 1  . Calcium Carbonate Antacid 600 MG chewable tablet Chew 600 mg by mouth 2 (two) times daily. For bone strength    . Cholecalciferol (VITAMIN D3) 50 MCG (2000 UT) TABS Take 2,000 Units by mouth daily.    . clopidogrel (PLAVIX) 75 MG tablet Take 1 tablet (75 mg total) by mouth daily. 30 tablet 0  . ENSURE PLUS (ENSURE PLUS) LIQD Take 237 mLs by mouth 3 (three) times daily between meals.    . folic acid (FOLVITE) 1 MG tablet Take 1 tablet (1 mg total) by mouth daily. 30 tablet 0  . losartan (COZAAR) 100 MG tablet Take 1 tablet (100 mg total) by mouth daily. 90 tablet 1  . Melatonin 3 MG TABS Take 3 mg by mouth at bedtime.    . pantoprazole (PROTONIX) 40 MG tablet Take 1 tablet (40 mg total) by mouth daily. (Patient taking differently: Take 40 mg by mouth 2 (two) times daily. For heartburn) 30 tablet 0  . promethazine (PHENERGAN) 12.5 MG tablet Take 1 tablet (12.5 mg total) by mouth every 6 (six) hours as needed for nausea or vomiting. 60 tablet 0  . rosuvastatin (CRESTOR) 20 MG tablet Take 1 tablet (20 mg total) by mouth daily at 6 PM. 30 tablet 0  . senna-docusate (SENNA S) 8.6-50 MG tablet Take 2 tablets by mouth at bedtime. For constipation    . Spacer/Aero-Holding Chambers (AEROCHAMBER PLUS FLO-VU LARGE) MISC 1 each by Other route once. 1 each 0  . vitamin B-12 1000 MCG tablet Take 1 tablet (1,000 mcg total) by mouth daily. 30 tablet 0   No facility-administered medications prior to visit.      Allergies:    Patient has no known allergies.   Social History   Socioeconomic History  . Marital status: Widowed    Spouse name: Not on file  . Number of children: Not on file  . Years of education: Not on file  . Highest education level: Not on file  Occupational History  . Not on file  Social Needs  . Financial resource strain: Not on file  . Food insecurity:    Worry: Not on file    Inability: Not on file  . Transportation  needs:    Medical: Not on file    Non-medical: Not on file  Tobacco Use  . Smoking status: Current Every Day Smoker    Packs/day: 0.50    Years: 65.00    Pack years: 32.50    Types: Cigarettes  . Smokeless tobacco: Never Used  Substance and Sexual Activity  . Alcohol use: No  . Drug use: No  . Sexual activity: Not Currently  Lifestyle  . Physical activity:    Days per week: Not on file    Minutes per session: Not on file  . Stress: Not on file  Relationships  . Social connections:    Talks on phone: Not on file    Gets together: Not on file    Attends religious service: Not on file    Active member of club or organization: Not on file    Attends meetings of clubs or organizations: Not on file    Relationship status: Not on file  Other Topics Concern  . Not on file  Social History Narrative  . Not on file     Family History:  The patient's family history includes Cancer in her daughter; Diabetes in her daughter; Heart disease in her daughter, father, and mother.   ROS:   Please see the history of present illness.    ROS All other systems reviewed and are negative.   PHYSICAL EXAM:   VS:  BP (!) 143/62   Pulse (!) 50   Ht 5\' 3"  (1.6 m)   Wt 78 lb (35.4 kg)   BMI 13.82 kg/m    GEN: Well nourished, well developed, in no acute distress  HEENT: normal  Neck: no JVD, carotid bruits, or masses Cardiac: RRR; no murmurs, rubs, or gallops,no edema  Respiratory:  clear to auscultation bilaterally, normal work of breathing GI: soft, nontender,  nondistended, + BS MS: no deformity or atrophy  Skin: warm and dry, no rash Neuro:  Alert and Oriented x 3, Strength and sensation are intact Psych: euthymic mood, full affect  Wt Readings from Last 3 Encounters:  10/14/18 78 lb (35.4 kg)  09/15/18 82 lb (37.2 kg)  08/24/18 74 lb 9.6 oz (33.8 kg)      Studies/Labs Reviewed:   EKG:  EKG is ordered today.  The ekg ordered today demonstrates sinus bradycardia  Recent Labs: 08/21/2018: ALT 8 08/23/2018: BUN 19; Creatinine, Ser 0.97; Potassium 3.5; Sodium 143 10/14/2018: Hemoglobin 8.9; Platelets 176; TSH 6.290   Lipid Panel    Component Value Date/Time   CHOL 250 (H) 08/21/2018 1523   TRIG 194 (H) 08/21/2018 1523   HDL 69 08/21/2018 1523   CHOLHDL 3.6 08/21/2018 1523   VLDL 39 08/21/2018 1523   LDLCALC 142 (H) 08/21/2018 1523    Additional studies/ records that were reviewed today include:   Echo 08/22/2018 LV EF: 65% -   70% Study Conclusions  - Left ventricle: The cavity size was normal. Wall thickness was   increased in a pattern of mild LVH. Systolic function was   vigorous. The estimated ejection fraction was in the range of 65%   to 70%. Wall motion was normal; there were no regional wall   motion abnormalities. Doppler parameters are consistent with   abnormal left ventricular relaxation (grade 1 diastolic   dysfunction). The E/e&' ratio is between 8-15, suggesting   indeterminate LV filing pressure. - Mitral valve: Mildly thickened leaflets . There was trivial   regurgitation. - Left atrium: Severely dilated. - Right  atrium: The atrium was normal in size. - Tricuspid valve: There was mild regurgitation. - Pulmonary arteries: PA peak pressure: 40 mm Hg (S). - Inferior vena cava: The vessel was normal in size. The   respirophasic diameter changes were in the normal range (>= 50%),   consistent with normal central venous pressure.  Impressions:  - LVEF 65-70%, mild LVH, normal wall motion, grade 1 DD,    indeterminate LV filling pressure, trivial MR, severe LAE, normal   RA size, mild TR, RVSP 40 mmg, normal IVC.   ASSESSMENT:    1. Bradycardia   2. Anemia, unspecified type   3. Essential hypertension   4. Hyperlipidemia LDL goal <70   5. Prediabetes   6. Chronic obstructive pulmonary disease, unspecified COPD type ([place])   7. Coronary artery disease involving native coronary artery of native heart without angina pectoris   8. PAF (paroxysmal atrial fibrillation) (H[facility])      PLAN:  In order of problems listed above:  1. Bradycardia: Her heart rate during the last office visit was in the 40s, I reduced her amiodarone to 100 mg daily.  Heart rate today has slightly improved.  She is asymptomatic with the current heart rate and able to do everything by herself at home.  We will continue to monitor.  Obtain TSH  2. History of anemia: Obtain CBC  3. CAD: Denies any recent chest pain.  Patient continued to be active at home.  Continue aspirin and Plavix  4. Hypertension: Blood pressure borderline elevated today, however we will hold off on adjusting blood pressure medication any further given the significant bradycardia.  She would do poorly with hypotension.  5. Hyperlipidemia: Continue Crestor  6. COPD: No acute exacerbation  7. PAF: He is not a candidate for systemic anticoagulation therapy.    Medication Adjustments/Labs and Tests Ordered: Current medicines are reviewed at length with the patient today.  Concerns regarding medicines are outlined above.  Medication changes, Labs and Tests ordered today are listed in the Patient Instructions below. Patient Instructions  Medication Instructions:  Your physician recommends that you continue on your current medications as directed. Please refer to the Current Medication list given to you today.  If you need a refill on your cardiac medications before your next appointment, please call your pharmacy.   Lab work: You will have labs  (blood work) drawn today: CBC  TSH  If you have labs (blood work) drawn today and your tests are completely normal, you will receive your results only by: Marland Kitchen MyChart Message (if you have MyChart) OR . A paper copy in the mail If you have any lab test that is abnormal or we need to change your treatment, we will call you to review the results.  Testing/Procedures: NONE   Follow-Up: Your physician recommends that you schedule a follow-up appointment in 3 months with Dr. Sallyanne Kuster   Any Other Special Instructions Will Be Listed Below (If Applicable).       Hilbert Corrigan, [place]  10/16/2018 11:06 PM    [facility] [address], [place], [place]  [address] Phone: [phone]; Fax: [phone]

## 2018-10-14 NOTE — Patient Instructions (Signed)
Medication Instructions:  Your physician recommends that you continue on your current medications as directed. Please refer to the Current Medication list given to you today.  If you need a refill on your cardiac medications before your next appointment, please call your pharmacy.   Lab work: You will have labs (blood work) drawn today: CBC  TSH  If you have labs (blood work) drawn today and your tests are completely normal, you will receive your results only by: Marland Kitchen MyChart Message (if you have MyChart) OR . A paper copy in the mail If you have any lab test that is abnormal or we need to change your treatment, we will call you to review the results.  Testing/Procedures: NONE   Follow-Up: Your physician recommends that you schedule a follow-up appointment in 3 months with Dr. Sallyanne Kuster   Any Other Special Instructions Will Be Listed Below (If Applicable).

## 2018-10-15 LAB — TSH: TSH: 6.29 u[IU]/mL — ABNORMAL HIGH (ref 0.450–4.500)

## 2018-10-16 ENCOUNTER — Encounter: Payer: Self-pay | Admitting: Physician Assistant

## 2018-10-17 NOTE — Progress Notes (Signed)
TSH elevated, will need futher workup by PCP.

## 2018-10-17 NOTE — Progress Notes (Signed)
Still anemia, but level is stable

## 2018-10-20 NOTE — Progress Notes (Signed)
The patient and her daughter Wayna Chalet has been notified of the result and verbalized understanding.  All questions (if any) were answered. Jacqulynn Cadet, [place] 10/20/2018 3:41 PM

## 2018-12-23 ENCOUNTER — Ambulatory Visit: Payer: Medicare (Managed Care) | Admitting: Cardiovascular Disease

## 2019-07-07 ENCOUNTER — Ambulatory Visit (HOSPITAL_COMMUNITY)
Admission: RE | Admit: 2019-07-07 | Discharge: 2019-07-07 | Disposition: A | Discharge status: Home / Self Care | Payer: Medicare (Managed Care) | Source: Ambulatory Visit | Attending: Nurse Practitioner | Admitting: Nurse Practitioner

## 2019-07-07 ENCOUNTER — Other Ambulatory Visit (HOSPITAL_COMMUNITY): Payer: Self-pay | Admitting: Nurse Practitioner

## 2019-07-07 ENCOUNTER — Other Ambulatory Visit: Payer: Self-pay

## 2019-07-07 DIAGNOSIS — M25522 Pain in left elbow: Secondary | ICD-10-CM | POA: Diagnosis not present

## 2019-07-07 DIAGNOSIS — M19012 Primary osteoarthritis, left shoulder: Secondary | ICD-10-CM | POA: Insufficient documentation

## 2019-07-07 DIAGNOSIS — M19042 Primary osteoarthritis, left hand: Secondary | ICD-10-CM | POA: Insufficient documentation

## 2019-07-07 DIAGNOSIS — W19XXXA Unspecified fall, initial encounter: Secondary | ICD-10-CM

## 2019-07-15 ENCOUNTER — Emergency Department (HOSPITAL_COMMUNITY): Payer: Medicare (Managed Care)

## 2019-07-15 ENCOUNTER — Inpatient Hospital Stay (HOSPITAL_COMMUNITY)
Admission: EM | Admit: 2019-07-15 | Discharge: 2019-08-11 | DRG: 871 | Disposition: E | Payer: Medicare (Managed Care) | Attending: Internal Medicine | Admitting: Internal Medicine

## 2019-07-15 ENCOUNTER — Encounter (HOSPITAL_COMMUNITY): Payer: Self-pay | Admitting: Emergency Medicine

## 2019-07-15 ENCOUNTER — Other Ambulatory Visit: Payer: Self-pay

## 2019-07-15 DIAGNOSIS — J449 Chronic obstructive pulmonary disease, unspecified: Secondary | ICD-10-CM | POA: Diagnosis present

## 2019-07-15 DIAGNOSIS — R6520 Severe sepsis without septic shock: Secondary | ICD-10-CM | POA: Diagnosis present

## 2019-07-15 DIAGNOSIS — A419 Sepsis, unspecified organism: Principal | ICD-10-CM | POA: Diagnosis present

## 2019-07-15 DIAGNOSIS — Z20828 Contact with and (suspected) exposure to other viral communicable diseases: Secondary | ICD-10-CM | POA: Diagnosis present

## 2019-07-15 DIAGNOSIS — F329 Major depressive disorder, single episode, unspecified: Secondary | ICD-10-CM | POA: Diagnosis present

## 2019-07-15 DIAGNOSIS — Z7902 Long term (current) use of antithrombotics/antiplatelets: Secondary | ICD-10-CM

## 2019-07-15 DIAGNOSIS — E785 Hyperlipidemia, unspecified: Secondary | ICD-10-CM | POA: Diagnosis present

## 2019-07-15 DIAGNOSIS — Z7982 Long term (current) use of aspirin: Secondary | ICD-10-CM

## 2019-07-15 DIAGNOSIS — I959 Hypotension, unspecified: Secondary | ICD-10-CM | POA: Diagnosis not present

## 2019-07-15 DIAGNOSIS — E119 Type 2 diabetes mellitus without complications: Secondary | ICD-10-CM | POA: Diagnosis present

## 2019-07-15 DIAGNOSIS — E559 Vitamin D deficiency, unspecified: Secondary | ICD-10-CM | POA: Diagnosis present

## 2019-07-15 DIAGNOSIS — R001 Bradycardia, unspecified: Secondary | ICD-10-CM | POA: Diagnosis present

## 2019-07-15 DIAGNOSIS — Z515 Encounter for palliative care: Secondary | ICD-10-CM | POA: Diagnosis not present

## 2019-07-15 DIAGNOSIS — Z8673 Personal history of transient ischemic attack (TIA), and cerebral infarction without residual deficits: Secondary | ICD-10-CM

## 2019-07-15 DIAGNOSIS — R627 Adult failure to thrive: Secondary | ICD-10-CM | POA: Diagnosis present

## 2019-07-15 DIAGNOSIS — Z8249 Family history of ischemic heart disease and other diseases of the circulatory system: Secondary | ICD-10-CM

## 2019-07-15 DIAGNOSIS — Z833 Family history of diabetes mellitus: Secondary | ICD-10-CM

## 2019-07-15 DIAGNOSIS — F419 Anxiety disorder, unspecified: Secondary | ICD-10-CM | POA: Diagnosis present

## 2019-07-15 DIAGNOSIS — G92 Toxic encephalopathy: Secondary | ICD-10-CM | POA: Diagnosis present

## 2019-07-15 DIAGNOSIS — N179 Acute kidney failure, unspecified: Secondary | ICD-10-CM

## 2019-07-15 DIAGNOSIS — F17210 Nicotine dependence, cigarettes, uncomplicated: Secondary | ICD-10-CM | POA: Diagnosis present

## 2019-07-15 DIAGNOSIS — Z9981 Dependence on supplemental oxygen: Secondary | ICD-10-CM

## 2019-07-15 DIAGNOSIS — Z66 Do not resuscitate: Secondary | ICD-10-CM | POA: Diagnosis present

## 2019-07-15 DIAGNOSIS — K219 Gastro-esophageal reflux disease without esophagitis: Secondary | ICD-10-CM | POA: Diagnosis present

## 2019-07-15 DIAGNOSIS — Z853 Personal history of malignant neoplasm of breast: Secondary | ICD-10-CM

## 2019-07-15 DIAGNOSIS — I480 Paroxysmal atrial fibrillation: Secondary | ICD-10-CM | POA: Diagnosis present

## 2019-07-15 DIAGNOSIS — M0689 Other specified rheumatoid arthritis, multiple sites: Secondary | ICD-10-CM | POA: Diagnosis present

## 2019-07-15 DIAGNOSIS — I214 Non-ST elevation (NSTEMI) myocardial infarction: Secondary | ICD-10-CM | POA: Diagnosis present

## 2019-07-15 DIAGNOSIS — Z7984 Long term (current) use of oral hypoglycemic drugs: Secondary | ICD-10-CM

## 2019-07-15 DIAGNOSIS — Z79890 Hormone replacement therapy (postmenopausal): Secondary | ICD-10-CM

## 2019-07-15 DIAGNOSIS — Z79899 Other long term (current) drug therapy: Secondary | ICD-10-CM

## 2019-07-15 DIAGNOSIS — G4700 Insomnia, unspecified: Secondary | ICD-10-CM | POA: Diagnosis present

## 2019-07-15 LAB — COMPREHENSIVE METABOLIC PANEL
ALT: 208 U/L — ABNORMAL HIGH (ref 0–44)
AST: 477 U/L — ABNORMAL HIGH (ref 15–41)
Albumin: 2.7 g/dL — ABNORMAL LOW (ref 3.5–5.0)
Alkaline Phosphatase: 83 U/L (ref 38–126)
Anion gap: 20 — ABNORMAL HIGH (ref 5–15)
BUN: 75 mg/dL — ABNORMAL HIGH (ref 8–23)
CO2: 24 mmol/L (ref 22–32)
Calcium: 7.6 mg/dL — ABNORMAL LOW (ref 8.9–10.3)
Chloride: 94 mmol/L — ABNORMAL LOW (ref 98–111)
Creatinine, Ser: 2.87 mg/dL — ABNORMAL HIGH (ref 0.44–1.00)
GFR calc Af Amer: 17 mL/min — ABNORMAL LOW (ref >60)
GFR calc non Af Amer: 14 mL/min — ABNORMAL LOW (ref >60)
Glucose, Bld: 181 mg/dL — ABNORMAL HIGH (ref 70–99)
Potassium: 3.7 mmol/L (ref 3.5–5.1)
Sodium: 138 mmol/L (ref 135–145)
Total Bilirubin: 1.4 mg/dL — ABNORMAL HIGH (ref 0.3–1.2)
Total Protein: 4.7 g/dL — ABNORMAL LOW (ref 6.5–8.1)

## 2019-07-15 LAB — POCT I-STAT 7, (LYTES, BLD GAS, ICA,H+H)
Acid-base deficit: 1 mmol/L (ref 0.0–2.0)
Bicarbonate: 27.3 mmol/L (ref 20.0–28.0)
Calcium, Ion: 1 mmol/L — ABNORMAL LOW (ref 1.15–1.40)
HCT: 26 % — ABNORMAL LOW (ref 36.0–46.0)
Hemoglobin: 8.8 g/dL — ABNORMAL LOW (ref 12.0–15.0)
O2 Saturation: 99 %
Patient temperature: 96
Potassium: 3.4 mmol/L — ABNORMAL LOW (ref 3.5–5.1)
Sodium: 139 mmol/L (ref 135–145)
TCO2: 29 mmol/L (ref 22–32)
pCO2 arterial: 60.3 mmHg — ABNORMAL HIGH (ref 32.0–48.0)
pH, Arterial: 7.255 — ABNORMAL LOW (ref 7.350–7.450)
pO2, Arterial: 148 mmHg — ABNORMAL HIGH (ref 83.0–108.0)

## 2019-07-15 LAB — DIFFERENTIAL
Abs Immature Granulocytes: 0.07 10*3/uL (ref 0.00–0.07)
Basophils Absolute: 0 10*3/uL (ref 0.0–0.1)
Basophils Relative: 0 %
Eosinophils Absolute: 0.1 10*3/uL (ref 0.0–0.5)
Eosinophils Relative: 1 %
Immature Granulocytes: 1 %
Lymphocytes Relative: 19 %
Lymphs Abs: 1.6 10*3/uL (ref 0.7–4.0)
Monocytes Absolute: 1 10*3/uL (ref 0.1–1.0)
Monocytes Relative: 12 %
Neutro Abs: 6.1 10*3/uL (ref 1.7–7.7)
Neutrophils Relative %: 67 %

## 2019-07-15 LAB — I-STAT CHEM 8, ED
BUN: 80 mg/dL — ABNORMAL HIGH (ref 8–23)
Calcium, Ion: 0.92 mmol/L — ABNORMAL LOW (ref 1.15–1.40)
Chloride: 93 mmol/L — ABNORMAL LOW (ref 98–111)
Creatinine, Ser: 2.9 mg/dL — ABNORMAL HIGH (ref 0.44–1.00)
Glucose, Bld: 156 mg/dL — ABNORMAL HIGH (ref 70–99)
HCT: 25 % — ABNORMAL LOW (ref 36.0–46.0)
Hemoglobin: 8.5 g/dL — ABNORMAL LOW (ref 12.0–15.0)
Potassium: 3.4 mmol/L — ABNORMAL LOW (ref 3.5–5.1)
Sodium: 136 mmol/L (ref 135–145)
TCO2: 28 mmol/L (ref 22–32)

## 2019-07-15 LAB — TROPONIN I (HIGH SENSITIVITY)
Troponin I (High Sensitivity): 1242 ng/L (ref <18)
Troponin I (High Sensitivity): 1759 ng/L (ref <18)

## 2019-07-15 LAB — LACTIC ACID, PLASMA
Lactic Acid, Venous: 3.9 mmol/L (ref 0.5–1.9)
Lactic Acid, Venous: 4 mmol/L (ref 0.5–1.9)

## 2019-07-15 LAB — CBC
HCT: 29 % — ABNORMAL LOW (ref 36.0–46.0)
Hemoglobin: 8.8 g/dL — ABNORMAL LOW (ref 12.0–15.0)
MCH: 31 pg (ref 26.0–34.0)
MCHC: 30.3 g/dL (ref 30.0–36.0)
MCV: 102.1 fL — ABNORMAL HIGH (ref 80.0–100.0)
Platelets: 149 10*3/uL — ABNORMAL LOW (ref 150–400)
RBC: 2.84 MIL/uL — ABNORMAL LOW (ref 3.87–5.11)
RDW: 14.9 % (ref 11.5–15.5)
WBC: 8.9 10*3/uL (ref 4.0–10.5)
nRBC: 0 % (ref 0.0–0.2)

## 2019-07-15 LAB — APTT: aPTT: 33 seconds (ref 24–36)

## 2019-07-15 LAB — MAGNESIUM: Magnesium: 2.3 mg/dL (ref 1.7–2.4)

## 2019-07-15 LAB — TSH: TSH: 2.703 u[IU]/mL (ref 0.350–4.500)

## 2019-07-15 LAB — POC SARS CORONAVIRUS 2 AG -  ED: SARS Coronavirus 2 Ag: NEGATIVE

## 2019-07-15 LAB — PROTIME-INR
INR: 1.3 — ABNORMAL HIGH (ref 0.8–1.2)
Prothrombin Time: 15.8 seconds — ABNORMAL HIGH (ref 11.4–15.2)

## 2019-07-15 LAB — CBG MONITORING, ED
Glucose-Capillary: 58 mg/dL — ABNORMAL LOW (ref 70–99)
Glucose-Capillary: 175 mg/dL — ABNORMAL HIGH (ref 70–99)

## 2019-07-15 MED ORDER — VANCOMYCIN VARIABLE DOSE PER UNSTABLE RENAL FUNCTION (PHARMACIST DOSING): Status: DC

## 2019-07-15 MED ORDER — HALOPERIDOL LACTATE 5 MG/ML IJ SOLN: 0.5000 mg | INTRAMUSCULAR | Status: DC | PRN

## 2019-07-15 MED ORDER — DEXTROSE 50 % IV SOLN
INTRAVENOUS | Status: AC
  Administered 2019-07-15: 25 mL
  Filled 2019-07-15: qty 50

## 2019-07-15 MED ORDER — SODIUM CHLORIDE 0.9 % IV SOLN
2.0000 g | Freq: Once | INTRAVENOUS | Status: AC
  Administered 2019-07-15: 11:00:00 2 g via INTRAVENOUS
  Filled 2019-07-15: qty 2

## 2019-07-15 MED ORDER — SODIUM CHLORIDE 0.9 % IV BOLUS
1000.0000 mL | Freq: Once | INTRAVENOUS | Status: AC
  Administered 2019-07-15: 11:00:00 1000 mL via INTRAVENOUS

## 2019-07-15 MED ORDER — MORPHINE 100MG IN NS 100ML (1MG/ML) PREMIX INFUSION
1.0000 mg/h | INTRAVENOUS | Status: DC
  Administered 2019-07-15: 15:00:00 1 mg/h via INTRAVENOUS
  Filled 2019-07-15: qty 100

## 2019-07-15 MED ORDER — HALOPERIDOL 1 MG PO TABS: 0.5000 mg | ORAL_TABLET | ORAL | Status: DC | PRN

## 2019-07-15 MED ORDER — GLYCOPYRROLATE 1 MG PO TABS
1.0000 mg | ORAL_TABLET | ORAL | Status: DC | PRN
  Filled 2019-07-15: qty 1

## 2019-07-15 MED ORDER — IOHEXOL 350 MG/ML SOLN
85.0000 mL | Freq: Once | INTRAVENOUS | Status: AC | PRN
  Administered 2019-07-15: 10:00:00 85 mL via INTRAVENOUS

## 2019-07-15 MED ORDER — LORAZEPAM 2 MG/ML IJ SOLN: 1.0000 mg | INTRAMUSCULAR | Status: DC | PRN

## 2019-07-15 MED ORDER — SODIUM CHLORIDE 0.9 % IV BOLUS
500.0000 mL | Freq: Once | INTRAVENOUS | Status: AC
  Administered 2019-07-15: 11:00:00 500 mL via INTRAVENOUS

## 2019-07-15 MED ORDER — POLYVINYL ALCOHOL 1.4 % OP SOLN
1.0000 [drp] | Freq: Four times a day (QID) | OPHTHALMIC | Status: DC | PRN
  Filled 2019-07-15: qty 15

## 2019-07-15 MED ORDER — METRONIDAZOLE IN NACL 5-0.79 MG/ML-% IV SOLN
500.0000 mg | Freq: Once | INTRAVENOUS | Status: AC
  Administered 2019-07-15: 11:00:00 500 mg via INTRAVENOUS
  Filled 2019-07-15: qty 100

## 2019-07-15 MED ORDER — VANCOMYCIN HCL IN DEXTROSE 1-5 GM/200ML-% IV SOLN: 1000.0000 mg | Freq: Once | INTRAVENOUS | Status: DC

## 2019-07-15 MED ORDER — SODIUM CHLORIDE 0.9 % IV SOLN: 1.0000 g | INTRAVENOUS | Status: DC

## 2019-07-15 MED ORDER — LORAZEPAM 2 MG/ML PO CONC: 1.0000 mg | ORAL | Status: DC | PRN

## 2019-07-15 MED ORDER — ONDANSETRON HCL 4 MG/2ML IJ SOLN
4.0000 mg | Freq: Four times a day (QID) | INTRAMUSCULAR | Status: DC | PRN
  Administered 2019-07-15: 4 mg via INTRAVENOUS
  Filled 2019-07-15: qty 2

## 2019-07-15 MED ORDER — GLYCOPYRROLATE 0.2 MG/ML IJ SOLN: 0.2000 mg | INTRAMUSCULAR | Status: DC | PRN

## 2019-07-15 MED ORDER — HALOPERIDOL LACTATE 2 MG/ML PO CONC
0.5000 mg | ORAL | Status: DC | PRN
  Filled 2019-07-15: qty 0.3

## 2019-07-15 MED ORDER — LORAZEPAM 1 MG PO TABS: 1.0000 mg | ORAL_TABLET | ORAL | Status: DC | PRN

## 2019-07-15 MED ORDER — ACETAMINOPHEN 325 MG PO TABS: 650.0000 mg | ORAL_TABLET | Freq: Four times a day (QID) | ORAL | Status: DC | PRN

## 2019-07-15 MED ORDER — SODIUM CHLORIDE 0.9% FLUSH: 3.0000 mL | Freq: Once | INTRAVENOUS | Status: DC

## 2019-07-15 MED ORDER — ONDANSETRON 4 MG PO TBDP: 4.0000 mg | ORAL_TABLET | Freq: Four times a day (QID) | ORAL | Status: DC | PRN

## 2019-07-15 MED ORDER — MORPHINE BOLUS VIA INFUSION
2.0000 mg | INTRAVENOUS | Status: DC | PRN
  Filled 2019-07-15: qty 2

## 2019-07-15 MED ORDER — SODIUM CHLORIDE 0.9 % IV SOLN: 1000.0000 mL | INTRAVENOUS | Status: DC

## 2019-07-15 MED ORDER — ACETAMINOPHEN 650 MG RE SUPP: 650.0000 mg | Freq: Four times a day (QID) | RECTAL | Status: DC | PRN

## 2019-07-15 MED ORDER — BIOTENE DRY MOUTH MT LIQD: 15.0000 mL | OROMUCOSAL | Status: DC | PRN

## 2019-07-20 LAB — CULTURE, BLOOD (ROUTINE X 2)
Culture: NO GROWTH
Culture: NO GROWTH
Special Requests: ADEQUATE

## 2019-08-11 NOTE — ED Notes (Signed)
Bear hugger applied to patient.

## 2019-08-11 NOTE — ED Notes (Signed)
Pt asystole on bedside monitor. Pt unresponsive, no pulse palpated, no respirations. Dr Truman [place]ward at bedside. Time of death called at 52

## 2019-08-11 NOTE — H&P (Signed)
Date: 07-26-2019               Patient Name:  Stacy Byrd MRN: [id number]  DOB: 05/04/1931 Age / Sex: 86 y.o., female   PCP: Janifer Adie, MD         Medical Service: Internal Medicine Teaching Service         Attending Physician: Dr. [place] Rasmussen, MD    First Contact: Dr. Sheppard Coil Pager: [phone]  Second Contact: Dr. Truman Hayward Pager: [phone]       After Hours (After 5p/  First Contact Pager: [phone]  weekends / holidays): Second Contact Pager: [phone]   Chief Complaint: AMS  History of Present Illness: History was obtained via chart review and family at the bedside.  Stacy Byrd is a 86 year old female with a past medical history significant for HTN, HLD, T2DM, GERD, COPD, malnutrition, and anxiety who presents with AMS.  Patient's family states the patient's functional status has declined since Thanksgiving.  On Thanksgiving day, the patient fell twice and injured her left arm.  In the following days, the patient's mental status had a large change from her baseline.  Her family states that she had become very confused as of Friday morning, noting that she had become incoherent.  She is also been calling out to her brother who has been dead for years.  Family also states that she had started hallucinating, saying that she was seeing things that other people are not seeing. The night before coming to the ED, the patient stated that she felt like she was dying and wanted to be let go.   In the ED, CBC, CMP, troponin, lactate, magnesium, TSH, blood cultures were drawn.  Hgb was at her baseline of 8.8.  BUN and creatinine was significantly elevated to 75 and 2.87 (BL .97).  AST and ALT elevated to 477 and 208 respectively.  Anion gap of 20.  Lactate elevated at 3.9 and 4.0.  Troponins elevated to 1242 and 1759.  She had a CT of the head did snot show acute intracranial abnormalities. CT Perfusion Angio Head & Neck was also unremarkable.   Meds:  Current Meds  Medication Sig  .  albuterol (PROVENTIL HFA;VENTOLIN HFA) 108 (90 BASE) MCG/ACT inhaler Inhale 2 puffs into the lungs every 4 (four) hours as needed for wheezing. (Patient taking differently: Inhale 2 puffs into the lungs 2 (two) times daily. )  . amiodarone (PACERONE) 200 MG tablet Take 0.5 tablets (100 mg total) by mouth daily for 30 days.  Marland Kitchen aspirin EC 81 MG EC tablet Take 1 tablet (81 mg total) by mouth daily.  Marland Kitchen atorvastatin (LIPITOR) 40 MG tablet Take 40 mg by mouth daily at 6 PM.  . benzonatate (TESSALON) 100 MG capsule TAKE ONE CAPSULE BY MOUTH 3 TIMES A DAY AS NEEDED FOR COUGH (Patient taking differently: Take 100 mg by mouth 3 (three) times daily as needed for cough. )  . bisoprolol-hydrochlorothiazide (ZIAC) 10-6.25 MG per tablet TAKE 1 TABLET EVERY MORNING (Patient taking differently: Take 1 tablet by mouth daily. For high blood pressure)  . Calcium Carbonate Antacid 600 MG chewable tablet Chew 600 mg by mouth 2 (two) times daily. For bone strength  . Cholecalciferol (VITAMIN D3) 50 MCG (2000 UT) TABS Take 2,000 Units by mouth daily.  Marland Kitchen ENSURE PLUS (ENSURE PLUS) LIQD Take 237 mLs by mouth 3 (three) times daily between meals.  . folic acid (FOLVITE) 1 MG tablet Take 1 tablet (1 mg total)  by mouth daily.  Marland Kitchen levothyroxine (SYNTHROID) 75 MCG tablet Take 37.5 mcg by mouth daily before breakfast.  . LORazepam (ATIVAN) 0.5 MG tablet Take 0.5 mg by mouth at bedtime.  Marland Kitchen morphine (MSIR) 15 MG tablet Take 7.5 mg by mouth 2 (two) times daily as needed for severe pain.  . pantoprazole (PROTONIX) 40 MG tablet Take 1 tablet (40 mg total) by mouth daily. (Patient taking differently: Take 40 mg by mouth 2 (two) times daily. For heartburn)  . promethazine (PHENERGAN) 12.5 MG tablet Take 1 tablet (12.5 mg total) by mouth every 6 (six) hours as needed for nausea or vomiting.  . senna-docusate (SENNA S) 8.6-50 MG tablet Take 2 tablets by mouth at bedtime. For constipation  . Spacer/Aero-Holding Chambers (AEROCHAMBER PLUS FLO-VU  LARGE) MISC 1 each by Other route once.  . valsartan (DIOVAN) 80 MG tablet Take 80 mg by mouth daily.  . vitamin B-12 1000 MCG tablet Take 1 tablet (1,000 mcg total) by mouth daily.   Allergies: Allergies as of 2019/08/12  . (No Known Allergies)   Past Medical History:  Diagnosis Date  . Anxiety   . Arthritis    RA ALL OVER "  . Breast cancer ([place])   . COPD (chronic obstructive pulmonary disease) ([place])   . Depression   . Diabetes mellitus without complication ([place])   . GERD (gastroesophageal reflux disease)   . Hyperlipidemia   . Hypertension   . Hypertensive CKD (chronic kidney disease)   . Insomnia   . Prediabetes   . Shortness of breath   . Unspecified protein-calorie malnutrition ([place]) 06/19/2013  . Vitamin D deficiency    Family History:  Family History  Problem Relation Age of Onset  . Heart disease Mother   . Heart disease Father   . Heart disease Daughter   . Cancer Daughter        lung  . Diabetes Daughter    Social History:  -Patient smokes 1 cigarette a day has been smoking for the last 60 years -No alcohol or recreational drug use  Review of Systems: Unable to assess due to patient's AMS  Imaging: CT Head: MPRESSION: 1. No acute finding by CT. Atrophy and chronic small-vessel ischemic changes. 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 Small old right parietal vertex cortical infarction, new since the study of 2015 but not acute.  CT Perfusion Angio Head & Neck: IMPRESSION: No large or medium vessel occlusion. No infarction demonstrated by perfusion. No convincing T-max prolongation. Probable artifactual finding at the left parietal vertex.  Widespread atherosclerotic disease. Aortic atherosclerosis. Scattered plaque affecting most major arterial vessels. No flow limiting stenosis at either carotid bifurcation. 50% stenosis of the right vertebral artery origin. Severe stenosis of the left vertebral artery origin, nearly occluded.  This non dominant small vessel does appear to show flow through its course however. There is a second severe stenosis at about the level of C4.  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  Upper cervical internal carotid arteries are tortuous and ectatic but do not show evidence of stenosis or dissection.  EKG:  Sinus rhythm Consider left atrial enlargement Borderline left axis deviation  Physical Exam: Blood pressure (!) 177/92, pulse (!) 51, temperature (!) 95 F (35 C), temperature source Rectal, resp. rate 17, height 5' (1.524 m), weight 34 kg, SpO2 100 %.  Physical Exam Vitals signs reviewed.  Constitutional:      General: She is in acute distress.     Appearance: She is ill-appearing  and toxic-appearing.     Comments: Uncomfortable and frail appearing  HENT:     Head: Normocephalic and atraumatic.     Mouth/Throat:     Mouth: Mucous membranes are dry.     Pharynx: No oropharyngeal exudate or posterior oropharyngeal erythema.  Eyes:     Comments: Pinpoint pupils, minimally reactive to light  Cardiovascular:     Rate and Rhythm: Bradycardia present.     Comments: Distant heart sounds, peripheral pulses thready Pulmonary:     Effort: Respiratory distress (Gasping and choking upon inspiration) present.     Breath sounds: No wheezing or rales.  Abdominal:     General: There is no distension.     Palpations: Abdomen is soft.     Tenderness: There is no abdominal tenderness. There is no guarding.  Musculoskeletal:     Right lower leg: No edema.     Left lower leg: No edema.     Comments: Cachectic appearing  Skin:    General: Skin is dry.     Comments: Severely decreased skin turgor diffusely  Neurological:     Mental Status: She is disoriented.    Assessment & Plan by Problem: Active Problems:   NSTEMI (non-ST elevated myocardial infarction) ([place])  In summary, Stacy Byrd is a 86 year old female w/ a past medical history significant for HTN, HLD, T2DM, GERD, COPD,  malnutrition, and anxiety who presents with AMS, NSTEMI and multiorgan failure.  The patient's family has elected to make her comfort care and does not wish to pursue any aggressive treatments.  #NSTEMI  #Multiorgan failure: Comfort care -Morphine infusion -As needed Ativan and Haldol -As needed Zofran -Glycopyrrolate as needed  #CODE STATUS: DNR  #Dispo: Comfort care.  Anticipated hospital death. Posey Pronto [phone]  Signed: Earlene Plater, MD Internal Medicine, PGY1 Pager: [phone]  08-10-2019,4:30 PM

## 2019-08-11 NOTE — Progress Notes (Signed)
Notified bedside nurse of need to draw repeat lactic acid. 

## 2019-08-11 NOTE — ED Notes (Signed)
[facility] services notified of death at 15.

## 2019-08-11 NOTE — ED Triage Notes (Signed)
[facility] reports patient family found patient altered and incontinent. She is A&O X0. Last known well 07/14/19 at 1800.Code stroke called at 9:16 am. Patient reacts to pain but does have coherent speech. She is on 3L that she wears at home.  Last vitals: CBG:69 155/72 96% on 3L [place] Respiratory rate-24  HR-55

## 2019-08-11 NOTE — Plan of Care (Signed)
Patient's family has discussed with the primary team and she has been made comfort measures only. Please call neurology if we could be of any help.  I have canceled the imaging orders.  -- Amie [place], MD [facility] Pager: [phone] If 7pm to 7am, please call on call as listed on AMION.

## 2019-08-11 NOTE — Code Documentation (Signed)
Code Stroke   Met patient and Stroke Team in CT. CTH and CTA negative. Patient was essentially not responsive with brief periods agonal breathing. Patient was taken back to the room, was incontinent, blood sugar was 58, it was treated, patient was cleaned up. After blood sugar was treated, patient was moving her arms around more spontaneously. Rectal temp 95. ED RN to place on bair hugger. Code Stroke was cancelled at [place], [place]

## 2019-08-11 NOTE — Progress Notes (Signed)
Pharmacy Antibiotic Note  Stacy Byrd is a 86 y.o. female admitted on 16-Jul-2019 with altered mental status.  Pharmacy has been consulted for vancomycin and cefepime dosing. Pt is afebrile and WBC is WNL. SCr is elevated well above baseline and lactic acid is also elevated.   Plan: Vancomycin 1gm IV x 1 then trend Scr for further doses Cefepime 2gm IV x 1 then 1gm IV Q24H F/u renal fxn, C&S, clinical status and peak/trough at SS  Height: 5' (152.4 cm) Weight: 75 lb (34 kg) IBW/kg (Calculated) : 45.5  Temp (24hrs), Avg:95 F (35 C), Min:95 F (35 C), Max:95 F (35 C)  Recent Labs  Lab Jul 16, 2019 1020 16-Jul-2019 1029  WBC PENDING  --   CREATININE  --  2.90*    Estimated Creatinine Clearance: 7.5 mL/min (A) (by C-G formula based on SCr of 2.9 mg/dL (H)).    No Known Allergies  Antimicrobials this admission: Vanc 12/5>> Cefepime 12/5>> Flagyl x 1 12/5  Dose adjustments this admission: N/A  Microbiology results: Pending  Thank you for allowing pharmacy to be a part of this patient's care.  Angela Platner, Rande Lawman 2019/07/16 11:22 AM

## 2019-08-11 NOTE — ED Notes (Signed)
Patient was code stroke and then code sepsis. Abx given before blood cultures drawn. Cultures being drawn now.

## 2019-08-11 NOTE — Progress Notes (Signed)
Notified bedside nurse of need to draw  Repeat lactate since repeat lactate 4 in 2 hours

## 2019-08-11 NOTE — Consult Note (Addendum)
NEURO HOSPITALIST CONSULT NOTE   Requesting Physician: Dr. Aletta Edouard    Chief Complaint: EMS called code stroke   History obtained from:  Chart and family  HPI:                                                                                                                                         Stacy Byrd is an 86 y.o. female with a history of hypertension, hyperlipidemia, diabetes, GERD, COPD, smoking, a-fib (not on anticoagulation). She is on 2L oxygen baseline. She was found unresponsive this morning by family, not speaking and incontinent of stool. Last seen normal the night prior by daughter-in-law at [facility] 07/14/2019. EMS called a code stroke, concern she was leaning towards her rightside. Upon arrival the patient had her eyes open, she was not following any commands and made no attempts at speech. No withdrawal to painful stimulation. A stat Head CT showed no acute stroke, old right frontal stroke. CTA of the head and neck showed no large vessel occlusion. She was outside of the tPA window and a diagnosis of stroke was felt unlikely. The code stroke was cancelled.   The neurology team attempted to call her daughter, listed as the emergency contact. However, the person answering question multiple times who was calling and why and hung up twice.  Following the code stroke, her daughter-in-law arrived at bedside and reports that the patient has been deteriorating over the past month. She is malnourished and unable to cook for herself. Family has been bringing meals for her. She reports the patient has had great difficulty walking over the past several days/weeks. EMS provided paperwork brought from home and daughter-in-law at bedside confirmed the patient is DNR/DNI.   Date last known well: Date: 07/14/2019 Time last known well: Time: 18:00 tPA Given: No: Outside tPA Window. Not felt to be a stroke.  Modified Rankin: Rankin Score=4  Past Medical History:  Diagnosis  Date  . Anxiety   . Arthritis    RA ALL OVER "  . Breast cancer ([place])   . COPD (chronic obstructive pulmonary disease) ([place])   . Depression   . Diabetes mellitus without complication ([place])   . GERD (gastroesophageal reflux disease)   . Hyperlipidemia   . Hypertension   . Hypertensive CKD (chronic kidney disease)   . Insomnia   . Prediabetes   . Shortness of breath   . Unspecified protein-calorie malnutrition ([place]) 06/19/2013  . Vitamin D deficiency     Past Surgical History:  Procedure Laterality Date  . APPENDECTOMY    . BREAST SURGERY  1998    Family History  Problem Relation Age of Onset  . Heart disease Mother   . Heart disease Father   . Heart disease Daughter   . Cancer Daughter        lung  . Diabetes Daughter  Social History:  reports that she has been smoking cigarettes. She has a 32.50 pack-year smoking history. She has never used smokeless tobacco. She reports that she does not drink alcohol or use drugs.  Allergies: No Known Allergies  Medications:                                                                                                                           I have reviewed the patient's current medications.  ROS:                                                                                                                                       History obtained from unobtainable from patient due to mental status   General Examination:                                                                                                      Blood pressure (!) 160/85, pulse (!) 54, temperature (!) 95 F (35 C), temperature source Rectal, resp. rate 18, height 5' (1.524 m), weight 34 kg, SpO2 100 %.  Physical Exam  Constitutional: Appears malnourished  Eyes: Normal external eye and conjunctiva. HENT: No lesions, without obvious abnormality.   Musculoskeletal-no joint tenderness, deformity or swelling Cardiovascular: Normal rate and regular  rhythm.  Respiratory: labored breathing GI: Soft.  No distension Skin: incontinent upon arrival from EMS  Neurological Examination Mental Status: No attempt at speech, not following any commands.  Cranial Nerves: Eyes open, not tracking, no gaze deviation, pupils round and reactive. No facial droop. Motor/Sensory: Not following any commands. No withdrawal to painful stimulation.  Decreased muscle tone throughout  Sensory:  Deep Tendon Reflexes: 2+ and symmetric throughout Cerebellar/Gait: Not able to test  Lab Results: Basic Metabolic Panel: Recent Labs  Lab Aug 02, 2019 1020 02-Aug-2019 1029  NA 138 136  K 3.7 3.4*  CL 94* 93*  CO2 24  --   GLUCOSE 181* 156*  BUN 75* 80*  CREATININE 2.87* 2.90*  CALCIUM 7.6*  --   MG 2.3  --     CBC: Recent Labs  Lab 2019/08/10 1020 Aug 10, 2019 1029  WBC 8.9  --   NEUTROABS 6.1  --   HGB 8.8* 8.5*  HCT 29.0* 25.0*  MCV 102.1*  --   PLT 149*  --    CBG: Recent Labs  Lab 08/10/2019 1013 08-10-2019 1035  GLUCAP 58* 175*   Imaging: Ct Code Stroke Cta Head W/wo Contrast  Result Date: August 10, 2019 CLINICAL DATA:  Unresponsive and leaning towards the right. Acute stroke presentation. EXAM: CT ANGIOGRAPHY HEAD AND NECK CT PERFUSION BRAIN TECHNIQUE: Multidetector CT imaging of the head and neck was performed using the standard protocol during bolus administration of intravenous contrast. Multiplanar CT image reconstructions and MIPs were obtained to evaluate the vascular anatomy. Carotid stenosis measurements (when applicable) are obtained utilizing NASCET criteria, using the distal internal carotid diameter as the denominator. Multiphase CT imaging of the brain was performed following IV bolus contrast injection. Subsequent parametric perfusion maps were calculated using RAPID software. CONTRAST:  24mL OMNIPAQUE IOHEXOL 350 MG/ML SOLN COMPARISON:  Head CT same day FINDINGS: CTA NECK FINDINGS Aortic arch: Diffuse aortic atherosclerotic calcification. No  aneurysm or dissection. Brachiocephalic vessel origins not included on the study. Right carotid system: Common carotid artery shows widespread atherosclerotic calcification but is widely patent to the bifurcation. There is calcified plaque at the carotid bifurcation and ICA bulb but no stenosis when compared to the more distal cervical ICA diameter. Upper cervical ICA is tortuous and mildly ectatic. Left carotid system: Common carotid artery shows calcified plaque but no stenosis greater than 20%. Calcified plaque at the carotid bifurcation and ICA bulb. No stenosis when compared to the more distal cervical ICA. Also on this side, the distal cervical ICA is tortuous and ectatic. Vertebral arteries: Calcified plaque at the right vertebral artery origin with stenosis of 50%. Beyond that, the dominant right vertebral artery shows scattered plaque but no stenosis and is widely patent to the foramen magnum. Non dominant left vertebral artery does not show visible patency at the origin, but I suspect there may be a very tiny patent lumen. Beyond that, the vessel shows plaque and does show opacification to the foramen magnum. Extensive calcified plaque at the C4 level also results in an additional severe stenosis of this vessel. Skeleton: Chronic cervical spondylosis and facet arthropathy. Other neck: No mass or lymphadenopathy. Upper chest: Advanced emphysema.  No active process otherwise. Review of the MIP images confirms the above findings CTA HEAD FINDINGS Anterior circulation: Both internal carotid arteries are patent through the skull base and siphon regions. Atherosclerotic calcification in the carotid siphons but no stenosis. The anterior and middle cerebral vessels are patent without evidence of large or medium vessel occlusion. 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 Posterior circulation: Dominant right vertebral artery is widely patent through the foramen magnum to the  basilar. Tiny left vertebral artery does show flow and apparently reaches the basilar. Both posteroinferior cerebellar arteries show flow. No basilar stenosis. Superior cerebellar and posterior cerebral arteries are patent. Venous sinuses: Patent and normal. Anatomic variants: None significant. Review of the MIP images confirms the above findings CT Brain Perfusion Findings: ASPECTS: 10 CBF (<30%) Volume: 81mL Perfusion (Tmax>6.0s) volume: 62mL. I believe this may well be artifactual as this is present at the extreme left parietal vertex. Mismatch Volume: 4ML, but suspicion of artifactual nature as described above. Infarction  Location:None IMPRESSION: No large or medium vessel occlusion. No infarction demonstrated by perfusion. No convincing T-max prolongation. Probable artifactual finding at the left parietal vertex. Widespread atherosclerotic disease. Aortic atherosclerosis. Scattered plaque affecting most major arterial vessels. No flow limiting stenosis at either carotid bifurcation. 50% stenosis of the right vertebral artery origin. Severe stenosis of the left vertebral artery origin, nearly occluded. This non dominant small vessel does appear to show flow through its course however. There is a second severe stenosis at about the level of C4. 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 Upper cervical internal carotid arteries are tortuous and ectatic but do not show evidence of stenosis or dissection. Electronically Signed   By: Nelson Chimes M.D.   On: 07/22/19 10:13   Ct Code Stroke Cta Neck W/wo Contrast  Result Date: 22-Jul-2019 CLINICAL DATA:  Unresponsive and leaning towards the right. Acute stroke presentation. EXAM: CT ANGIOGRAPHY HEAD AND NECK CT PERFUSION BRAIN TECHNIQUE: Multidetector CT imaging of the head and neck was performed using the standard protocol during bolus administration of intravenous contrast. Multiplanar CT image reconstructions and MIPs were obtained to evaluate the vascular anatomy.  Carotid stenosis measurements (when applicable) are obtained utilizing NASCET criteria, using the distal internal carotid diameter as the denominator. Multiphase CT imaging of the brain was performed following IV bolus contrast injection. Subsequent parametric perfusion maps were calculated using RAPID software. CONTRAST:  62mL OMNIPAQUE IOHEXOL 350 MG/ML SOLN COMPARISON:  Head CT same day FINDINGS: CTA NECK FINDINGS Aortic arch: Diffuse aortic atherosclerotic calcification. No aneurysm or dissection. Brachiocephalic vessel origins not included on the study. Right carotid system: Common carotid artery shows widespread atherosclerotic calcification but is widely patent to the bifurcation. There is calcified plaque at the carotid bifurcation and ICA bulb but no stenosis when compared to the more distal cervical ICA diameter. Upper cervical ICA is tortuous and mildly ectatic. Left carotid system: Common carotid artery shows calcified plaque but no stenosis greater than 20%. Calcified plaque at the carotid bifurcation and ICA bulb. No stenosis when compared to the more distal cervical ICA. Also on this side, the distal cervical ICA is tortuous and ectatic. Vertebral arteries: Calcified plaque at the right vertebral artery origin with stenosis of 50%. Beyond that, the dominant right vertebral artery shows scattered plaque but no stenosis and is widely patent to the foramen magnum. Non dominant left vertebral artery does not show visible patency at the origin, but I suspect there may be a very tiny patent lumen. Beyond that, the vessel shows plaque and does show opacification to the foramen magnum. Extensive calcified plaque at the C4 level also results in an additional severe stenosis of this vessel. Skeleton: Chronic cervical spondylosis and facet arthropathy. Other neck: No mass or lymphadenopathy. Upper chest: Advanced emphysema.  No active process otherwise. Review of the MIP images confirms the above findings CTA  HEAD FINDINGS Anterior circulation: Both internal carotid arteries are patent through the skull base and siphon regions. Atherosclerotic calcification in the carotid siphons but no stenosis. The anterior and middle cerebral vessels are patent without evidence of large or medium vessel occlusion. 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 Posterior circulation: Dominant right vertebral artery is widely patent through the foramen magnum to the basilar. Tiny left vertebral artery does show flow and apparently reaches the basilar. Both posteroinferior cerebellar arteries show flow. No basilar stenosis. Superior cerebellar and posterior cerebral arteries are patent. Venous sinuses: Patent and normal. Anatomic variants: None significant.  Review of the MIP images confirms the above findings CT Brain Perfusion Findings: ASPECTS: 10 CBF (<30%) Volume: 68mL Perfusion (Tmax>6.0s) volume: 75mL. I believe this may well be artifactual as this is present at the extreme left parietal vertex. Mismatch Volume: 4ML, but suspicion of artifactual nature as described above. Infarction Location:None IMPRESSION: No large or medium vessel occlusion. No infarction demonstrated by perfusion. No convincing T-max prolongation. Probable artifactual finding at the left parietal vertex. Widespread atherosclerotic disease. Aortic atherosclerosis. Scattered plaque affecting most major arterial vessels. No flow limiting stenosis at either carotid bifurcation. 50% stenosis of the right vertebral artery origin. Severe stenosis of the left vertebral artery origin, nearly occluded. This non dominant small vessel does appear to show flow through its course however. There is a second severe stenosis at about the level of C4. 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 Upper cervical internal carotid arteries are tortuous and ectatic but do not show evidence of stenosis or dissection. Electronically Signed   By: Nelson Chimes M.D.    On: 08-01-19 10:13   Ct Code Stroke Cerebral Perfusion With Contrast  Result Date: 08-01-2019 CLINICAL DATA:  Unresponsive and leaning towards the right. Acute stroke presentation. EXAM: CT ANGIOGRAPHY HEAD AND NECK CT PERFUSION BRAIN TECHNIQUE: Multidetector CT imaging of the head and neck was performed using the standard protocol during bolus administration of intravenous contrast. Multiplanar CT image reconstructions and MIPs were obtained to evaluate the vascular anatomy. Carotid stenosis measurements (when applicable) are obtained utilizing NASCET criteria, using the distal internal carotid diameter as the denominator. Multiphase CT imaging of the brain was performed following IV bolus contrast injection. Subsequent parametric perfusion maps were calculated using RAPID software. CONTRAST:  70mL OMNIPAQUE IOHEXOL 350 MG/ML SOLN COMPARISON:  Head CT same day FINDINGS: CTA NECK FINDINGS Aortic arch: Diffuse aortic atherosclerotic calcification. No aneurysm or dissection. Brachiocephalic vessel origins not included on the study. Right carotid system: Common carotid artery shows widespread atherosclerotic calcification but is widely patent to the bifurcation. There is calcified plaque at the carotid bifurcation and ICA bulb but no stenosis when compared to the more distal cervical ICA diameter. Upper cervical ICA is tortuous and mildly ectatic. Left carotid system: Common carotid artery shows calcified plaque but no stenosis greater than 20%. Calcified plaque at the carotid bifurcation and ICA bulb. No stenosis when compared to the more distal cervical ICA. Also on this side, the distal cervical ICA is tortuous and ectatic. Vertebral arteries: Calcified plaque at the right vertebral artery origin with stenosis of 50%. Beyond that, the dominant right vertebral artery shows scattered plaque but no stenosis and is widely patent to the foramen magnum. Non dominant left vertebral artery does not show visible  patency at the origin, but I suspect there may be a very tiny patent lumen. Beyond that, the vessel shows plaque and does show opacification to the foramen magnum. Extensive calcified plaque at the C4 level also results in an additional severe stenosis of this vessel. Skeleton: Chronic cervical spondylosis and facet arthropathy. Other neck: No mass or lymphadenopathy. Upper chest: Advanced emphysema.  No active process otherwise. Review of the MIP images confirms the above findings CTA HEAD FINDINGS Anterior circulation: Both internal carotid arteries are patent through the skull base and siphon regions. Atherosclerotic calcification in the carotid siphons but no stenosis. The anterior and middle cerebral vessels are patent without evidence of large or medium vessel occlusion. 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 Posterior circulation:  Dominant right vertebral artery is widely patent through the foramen magnum to the basilar. Tiny left vertebral artery does show flow and apparently reaches the basilar. Both posteroinferior cerebellar arteries show flow. No basilar stenosis. Superior cerebellar and posterior cerebral arteries are patent. Venous sinuses: Patent and normal. Anatomic variants: None significant. Review of the MIP images confirms the above findings CT Brain Perfusion Findings: ASPECTS: 10 CBF (<30%) Volume: 15mL Perfusion (Tmax>6.0s) volume: 59mL. I believe this may well be artifactual as this is present at the extreme left parietal vertex. Mismatch Volume: 4ML, but suspicion of artifactual nature as described above. Infarction Location:None IMPRESSION: No large or medium vessel occlusion. No infarction demonstrated by perfusion. No convincing T-max prolongation. Probable artifactual finding at the left parietal vertex. Widespread atherosclerotic disease. Aortic atherosclerosis. Scattered plaque affecting most major arterial vessels. No flow limiting stenosis at either  carotid bifurcation. 50% stenosis of the right vertebral artery origin. Severe stenosis of the left vertebral artery origin, nearly occluded. This non dominant small vessel does appear to show flow through its course however. There is a second severe stenosis at about the level of C4. 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 Upper cervical internal carotid arteries are tortuous and ectatic but do not show evidence of stenosis or dissection. Electronically Signed   By: Nelson Chimes M.D.   On: 08-08-2019 10:13   Ct Head Code Stroke Wo Contrast  Result Date: 08/08/19 CLINICAL DATA:  Code stroke. Unresponsive and leaning towards the right side. EXAM: CT HEAD WITHOUT CONTRAST TECHNIQUE: Contiguous axial images were obtained from the base of the skull through the vertex without intravenous contrast. COMPARISON:  09/22/2013 FINDINGS: Brain: There chronic appearing small vessel changes of the pons. Few old small vessel cerebellar infarctions. Chronic small-vessel ischemic changes affect the cerebral hemispheric white matter. 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 Small right parietal vertex cortical and subcortical infarction, not visible on the previous study but appearing old. No sign of acute infarction, mass lesion, hemorrhage, hydrocephalus or extra-axial collection. Vascular: There is atherosclerotic calcification of the major vessels at the base of the brain. Skull: Negative Sinuses/Orbits: Clear/normal Other: None ASPECTS ([place] Stroke Program Early CT Score) - Ganglionic level infarction (caudate, lentiform nuclei, internal capsule, insula, M1-M3 cortex): 7 - Supraganglionic infarction (M4-M6 cortex): 3 Total score (0-10 with 10 being normal): 10 IMPRESSION: 1. No acute finding by CT. Atrophy and chronic small-vessel ischemic changes. 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 Small old right parietal vertex cortical infarction, new since the study of 2015 but not acute. 2. ASPECTS  is 10 3. These results were communicated to Dr. Rory Percy at 9:37 amon 12-29-2020by text page via the [facility] messaging system. Electronically Signed   By: Nelson Chimes M.D.   On: Aug 08, 2019 09:38   August 08, 2019, 10:48 AM   Assessment: 86 y.o. female with a history of hypertension, hyperlipidemia, diabetes, GERD, COPD, 2L 02 baseline, smoking, a-fib, not on anticoagulation. According to family she has had severe decline over the past month and great difficulty walking over the past several days. She is malnourished with poor appetite and unable to cook for herself, family provides meals. She was last seen well at [facility] last night by family and found this morning on the couch unresponsive by family. EMS called a code stroke. Upon arrival she had her eyes open, but was non-verbal, not following commands and not withdrawing to painful stimulation. Head CT was negative for acute stroke. CTA was negative for a large vessel occlusion. She was  outside the tPA window and a diagnosis of stroke was felt unlikely. Family has confirmed she is DNR/DNI.     Impression:  1. Failure to thrive 2. Stroke unlikely - brain MRI to rule out stroke.   Recommendations: --ABG --MRI of brain without contrast - please recall if positive for stroke. In the absence of MRI evidence of stroke, this is likely toxic metabolic encephalopathy and I would not pursue any further stroke work-up and no further neurological recommendations other than to fix the toxic metabolic derangements.  Gwenyth Bouillon DNP, FNP-C  [facility]  Attending Neurohospitalist Addendum Patient seen and examined with APP/Resident. Agree with the history and physical as documented above. Agree with the plan as documented, which I helped formulate. I have independently reviewed the chart, obtained history, review of systems and examined the patient.I have personally reviewed pertinent head/neck/spine imaging (CT/MRI). Please feel free to call with any  questions. --- Amie Portland, MD [facility] Pager: [phone] If 7pm to 7am, please call on call as listed on AMION.  CRITICAL CARE ATTESTATION Performed by: Amie Portland, MD Total critical care time: 50 minutes Critical care time was exclusive of separately billable procedures and treating other patients and/or supervising APPs/Residents/Students Critical care was necessary to treat or prevent imminent or life-threatening deterioration due to toxic metabolic encephalopathy, strokelike symptoms This patient is critically ill and at significant risk for neurological worsening and/or death and care requires constant monitoring. Critical care was time spent personally by me on the following activities: development of treatment plan with patient and/or surrogate as well as nursing, discussions with consultants, evaluation of patient's response to treatment, examination of patient, obtaining history from patient or surrogate, ordering and performing treatments and interventions, ordering and review of laboratory studies, ordering and review of radiographic studies, pulse oximetry, re-evaluation of patient's condition, participation in multidisciplinary rounds and medical decision making of high complexity in the care of this patient.

## 2019-08-11 NOTE — Progress Notes (Signed)
Examined and evaluted Ms.Alleman at bedside. Observed to be unresponsive with no respirations. Dilated pupil with no reflexes. Absent pulses. Time of death at 5:02pm

## 2019-08-11 NOTE — ED Notes (Signed)
Eye care done. Ring to [employer] Envelope # [id number].

## 2019-08-11 NOTE — ED Provider Notes (Addendum)
[facility] EMERGENCY DEPARTMENT Provider Note   CSN: [id number] Arrival date & time: 26-Jul-2019  [id number]  An emergency department physician performed an initial assessment on this suspected stroke patient at [phone].  History   Chief Complaint Chief Complaint  Patient presents with   Code Stroke    HPI Level 5 caveat secondary to confusion, sepsis Stacy Byrd is a 86 y.o. female with history of hypertension, hyperlipidemia, diabetes, GERD, COPD who presents with cough stroke.  Patient unresponsive and leaning towards the right side.  Per patient's daughter-in-Shaniah Baltes, she is not answering her phone and she went to the house to check on her.  She was sitting on the couch as above.  Patient has had failure to thrive over the last month.  She saw her doctor at [facility] about 1 week ago. Patient has DNR/DNI wishes.  This is confirmed with daughter-in-Nas Wafer at bedside.  Patient nonresponsive and cannot contribute to history.     HPI  Past Medical History:  Diagnosis Date   Anxiety    Arthritis    RA ALL OVER "   Breast cancer ([place])    COPD (chronic obstructive pulmonary disease) ([place])    Depression    Diabetes mellitus without complication (HCC)    GERD (gastroesophageal reflux disease)    Hyperlipidemia    Hypertension    Hypertensive CKD (chronic kidney disease)    Insomnia    Prediabetes    Shortness of breath    Unspecified protein-calorie malnutrition ([place]) 06/19/2013   Vitamin D deficiency     Patient Active Problem List   Diagnosis Date Noted   NSTEMI (non-ST elevated myocardial infarction) ([place])    Paroxysmal atrial fibrillation ([place])    Arterial hypotension    ACS (acute coronary syndrome) ([place]) 08/21/2018   Encounter for long-term (current) use of other medications 11/30/2013   Anemia 11/25/2013   Nausea & vomiting, abdominal pain 11/25/2013   Unspecified protein-calorie malnutrition ([place]) 06/19/2013   Hypertension      Hyperlipidemia    COPD    Anxiety    Depression    Prediabetes    Vitamin D deficiency    Insomnia    Hypertensive CKD (chronic kidney disease)     Past Surgical History:  Procedure Laterality Date   APPENDECTOMY     BREAST SURGERY  1998     OB History   No obstetric history on file.      Home Medications    Prior to Admission medications   Medication Sig Start Date End Date Taking? Authorizing Provider  albuterol (PROVENTIL HFA;VENTOLIN HFA) 108 (90 BASE) MCG/ACT inhaler Inhale 2 puffs into the lungs every 4 (four) hours as needed for wheezing. Patient taking differently: Inhale 2 puffs into the lungs 2 (two) times daily.  08/30/13  Yes Kelby Aline, PA-C  amiodarone (PACERONE) 200 MG tablet Take 0.5 tablets (100 mg total) by mouth daily for 30 days. 09/15/18 2019-07-26 Yes Almyra Deforest, PA  aspirin EC 81 MG EC tablet Take 1 tablet (81 mg total) by mouth daily. 11/27/13  Yes Hongalgi, Lenis Dickinson, MD  atorvastatin (LIPITOR) 40 MG tablet Take 40 mg by mouth daily at 6 PM.   Yes [provider]  benzonatate (TESSALON) 100 MG capsule TAKE ONE CAPSULE BY MOUTH 3 TIMES A DAY AS NEEDED FOR COUGH Patient taking differently: Take 100 mg by mouth 3 (three) times daily as needed for cough.    Yes Vicie Mutters, PA-C  bisoprolol-hydrochlorothiazide [facility])  10-6.25 MG per tablet TAKE 1 TABLET EVERY MORNING Patient taking differently: Take 1 tablet by mouth daily. For high blood pressure   Yes Unk Pinto, MD  Calcium Carbonate Antacid 600 MG chewable tablet Chew 600 mg by mouth 2 (two) times daily. For bone strength   Yes [provider]  Cholecalciferol (VITAMIN D3) 50 MCG (2000 UT) TABS Take 2,000 Units by mouth daily.   Yes [provider]  ENSURE PLUS (ENSURE PLUS) LIQD Take 237 mLs by mouth 3 (three) times daily between meals.   Yes [provider]  folic acid (FOLVITE) 1 MG tablet Take 1 tablet (1 mg total) by mouth daily. 08/25/18  Yes  Bloomfield, Carley D, DO  levothyroxine (SYNTHROID) 75 MCG tablet Take 37.5 mcg by mouth daily before breakfast.   Yes [provider]  LORazepam (ATIVAN) 0.5 MG tablet Take 0.5 mg by mouth at bedtime.   Yes [provider]  morphine (MSIR) 15 MG tablet Take 7.5 mg by mouth 2 (two) times daily as needed for severe pain.   Yes [provider]  pantoprazole (PROTONIX) 40 MG tablet Take 1 tablet (40 mg total) by mouth daily. Patient taking differently: Take 40 mg by mouth 2 (two) times daily. For heartburn 11/27/13  Yes Hongalgi, Lenis Dickinson, MD  promethazine (PHENERGAN) 12.5 MG tablet Take 1 tablet (12.5 mg total) by mouth every 6 (six) hours as needed for nausea or vomiting. 01/05/14  Yes Vicie Mutters, PA-C  senna-docusate (SENNA S) 8.6-50 MG tablet Take 2 tablets by mouth at bedtime. For constipation   Yes [provider]  Spacer/Aero-Holding Chambers (AEROCHAMBER PLUS FLO-VU LARGE) MISC 1 each by Other route once. 07/30/13  Yes Baker, Zachary H, PA-C  valsartan (DIOVAN) 80 MG tablet Take 80 mg by mouth daily.   Yes [provider]  vitamin B-12 1000 MCG tablet Take 1 tablet (1,000 mcg total) by mouth daily. 08/25/18  Yes Bloomfield, Carley D, DO  clopidogrel (PLAVIX) 75 MG tablet Take 1 tablet (75 mg total) by mouth daily. Patient not taking: Reported on 2019/07/27 08/25/18   Modena Nunnery D, DO  rosuvastatin (CRESTOR) 20 MG tablet Take 1 tablet (20 mg total) by mouth daily at 6 PM. Patient not taking: Reported on 27-Jul-2019 08/24/18   Delice [place], DO    Family History Family History  Problem Relation Age of Onset   Heart disease Mother    Heart disease Father    Heart disease Daughter    Cancer Daughter        lung   Diabetes Daughter     Social History Social History   Tobacco Use   Smoking status: Current Every Day Smoker    Packs/day: 0.50    Years: 65.00    Pack years: 32.50    Types: Cigarettes   Smokeless tobacco:  Never Used  Substance Use Topics   Alcohol use: No   Drug use: No     Allergies   Patient has no known allergies.   Review of Systems Review of Systems  Unable to perform ROS: Patient unresponsive     Physical Exam Updated Vital Signs BP (!) 177/92    Pulse (!) 51    Temp (!) 95 F (35 C) (Rectal)    Resp 17    Ht 5' (1.524 m)    Wt 34 kg    SpO2 100%    BMI 14.65 kg/m   Physical Exam Vitals signs and nursing note reviewed.  Constitutional:  General: She is not in acute distress.    Appearance: She is well-developed. She is ill-appearing. She is not diaphoretic.     Comments: Very thin, cachectic appearing  HENT:     Head: Normocephalic and atraumatic.     Mouth/Throat:     Pharynx: No oropharyngeal exudate.  Eyes:     General: No scleral icterus.       Right eye: No discharge.        Left eye: No discharge.     Conjunctiva/sclera: Conjunctivae normal.     Pupils: Pupils are equal, round, and reactive to light.  Neck:     Musculoskeletal: Normal range of motion and neck supple.     Thyroid: No thyromegaly.  Cardiovascular:     Rate and Rhythm: Normal rate and regular rhythm.     Heart sounds: Normal heart sounds. No murmur. No friction rub. No gallop.   Pulmonary:     Effort: Pulmonary effort is normal. No respiratory distress.     Breath sounds: Normal breath sounds. No stridor. No wheezing or rales.  Abdominal:     General: Bowel sounds are normal. There is no distension.     Palpations: Abdomen is soft.     Tenderness: There is no abdominal tenderness. There is no guarding or rebound.  Lymphadenopathy:     Cervical: No cervical adenopathy.  Skin:    General: Skin is warm and dry.     Coloration: Skin is not pale.     Findings: No rash.     Comments: ~10 cm skin tear to left posterior upper arm from fall on Thanksgiving day (07/06/2019) well-appearing, no drainage or erythema  Neurological:     Mental Status: She is unresponsive.     GCS: GCS eye  subscore is 2. GCS verbal subscore is 1. GCS motor subscore is 1.     Coordination: Coordination normal.     Comments: Leftward gaze, pinpoint pupils; patient cannot follow commands; not reactive to pain      ED Treatments / Results  Labs (all labs ordered are listed, but only abnormal results are displayed) Labs Reviewed  PROTIME-INR - Abnormal; Notable for the following components:      Result Value   Prothrombin Time 15.8 (*)    INR 1.3 (*)    All other components within normal limits  CBC - Abnormal; Notable for the following components:   RBC 2.84 (*)    Hemoglobin 8.8 (*)    HCT 29.0 (*)    MCV 102.1 (*)    Platelets 149 (*)    All other components within normal limits  COMPREHENSIVE METABOLIC PANEL - Abnormal; Notable for the following components:   Chloride 94 (*)    Glucose, Bld 181 (*)    BUN 75 (*)    Creatinine, Ser 2.87 (*)    Calcium 7.6 (*)    Total Protein 4.7 (*)    Albumin 2.7 (*)    AST 477 (*)    ALT 208 (*)    Total Bilirubin 1.4 (*)    GFR calc non Af Amer 14 (*)    GFR calc Af Amer 17 (*)    Anion gap 20 (*)    All other components within normal limits  LACTIC ACID, PLASMA - Abnormal; Notable for the following components:   Lactic Acid, Venous 3.9 (*)    All other components within normal limits  LACTIC ACID, PLASMA - Abnormal; Notable for the following components:   Lactic  Acid, Venous 4.0 (*)    All other components within normal limits  I-STAT CHEM 8, ED - Abnormal; Notable for the following components:   Potassium 3.4 (*)    Chloride 93 (*)    BUN 80 (*)    Creatinine, Ser 2.90 (*)    Glucose, Bld 156 (*)    Calcium, Ion 0.92 (*)    Hemoglobin 8.5 (*)    HCT 25.0 (*)    All other components within normal limits  CBG MONITORING, ED - Abnormal; Notable for the following components:   Glucose-Capillary 58 (*)    All other components within normal limits  CBG MONITORING, ED - Abnormal; Notable for the following components:    Glucose-Capillary 175 (*)    All other components within normal limits  POCT I-STAT 7, (LYTES, BLD GAS, ICA,H+H) - Abnormal; Notable for the following components:   pH, Arterial 7.255 (*)    pCO2 arterial 60.3 (*)    pO2, Arterial 148.0 (*)    Potassium 3.4 (*)    Calcium, Ion 1.00 (*)    HCT 26.0 (*)    Hemoglobin 8.8 (*)    All other components within normal limits  TROPONIN I (HIGH SENSITIVITY) - Abnormal; Notable for the following components:   Troponin I (High Sensitivity) 1,242 (*)    All other components within normal limits  CULTURE, BLOOD (ROUTINE X 2)  CULTURE, BLOOD (ROUTINE X 2)  URINE CULTURE  APTT  DIFFERENTIAL  MAGNESIUM  TSH  URINALYSIS, ROUTINE W REFLEX MICROSCOPIC  POC SARS CORONAVIRUS 2 AG -  ED  I-STAT ARTERIAL BLOOD GAS, ED  TROPONIN I (HIGH SENSITIVITY)    EKG EKG Interpretation  Date/Time:  August 08, 2019 10:14:55 EST Ventricular Rate:  55 PR Interval:    QRS Duration: 103 QT Interval:  551 QTC Calculation: 528 R Axis:   -25 Text Interpretation: Sinus rhythm Consider left atrial enlargement Borderline left axis deviation Anteroseptal infarct, age indeterminate Prolonged QT interval slower rate and diffuse st/ts ?electrolyte Confirmed by Aletta Edouard [phone]) on 08-08-2019 10:43:47 AM   Radiology Ct Code Stroke Cta Head W/wo Contrast  Result Date: 08-Aug-2019 CLINICAL DATA:  Unresponsive and leaning towards the right. Acute stroke presentation. EXAM: CT ANGIOGRAPHY HEAD AND NECK CT PERFUSION BRAIN TECHNIQUE: Multidetector CT imaging of the head and neck was performed using the standard protocol during bolus administration of intravenous contrast. Multiplanar CT image reconstructions and MIPs were obtained to evaluate the vascular anatomy. Carotid stenosis measurements (when applicable) are obtained utilizing NASCET criteria, using the distal internal carotid diameter as the denominator. Multiphase CT imaging of the brain was performed  following IV bolus contrast injection. Subsequent parametric perfusion maps were calculated using RAPID software. CONTRAST:  26mL OMNIPAQUE IOHEXOL 350 MG/ML SOLN COMPARISON:  Head CT same day FINDINGS: CTA NECK FINDINGS Aortic arch: Diffuse aortic atherosclerotic calcification. No aneurysm or dissection. Brachiocephalic vessel origins not included on the study. Right carotid system: Common carotid artery shows widespread atherosclerotic calcification but is widely patent to the bifurcation. There is calcified plaque at the carotid bifurcation and ICA bulb but no stenosis when compared to the more distal cervical ICA diameter. Upper cervical ICA is tortuous and mildly ectatic. Left carotid system: Common carotid artery shows calcified plaque but no stenosis greater than 20%. Calcified plaque at the carotid bifurcation and ICA bulb. No stenosis when compared to the more distal cervical ICA. Also on this side, the distal cervical ICA is tortuous and ectatic. Vertebral arteries: Calcified plaque  at the right vertebral artery origin with stenosis of 50%. Beyond that, the dominant right vertebral artery shows scattered plaque but no stenosis and is widely patent to the foramen magnum. Non dominant left vertebral artery does not show visible patency at the origin, but I suspect there may be a very tiny patent lumen. Beyond that, the vessel shows plaque and does show opacification to the foramen magnum. Extensive calcified plaque at the C4 level also results in an additional severe stenosis of this vessel. Skeleton: Chronic cervical spondylosis and facet arthropathy. Other neck: No mass or lymphadenopathy. Upper chest: Advanced emphysema.  No active process otherwise. Review of the MIP images confirms the above findings CTA HEAD FINDINGS Anterior circulation: Both internal carotid arteries are patent through the skull base and siphon regions. Atherosclerotic calcification in the carotid siphons but no stenosis. The  anterior and middle cerebral vessels are patent without evidence of large or medium vessel occlusion. 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 Posterior circulation: Dominant right vertebral artery is widely patent through the foramen magnum to the basilar. Tiny left vertebral artery does show flow and apparently reaches the basilar. Both posteroinferior cerebellar arteries show flow. No basilar stenosis. Superior cerebellar and posterior cerebral arteries are patent. Venous sinuses: Patent and normal. Anatomic variants: None significant. Review of the MIP images confirms the above findings CT Brain Perfusion Findings: ASPECTS: 10 CBF (<30%) Volume: 4mL Perfusion (Tmax>6.0s) volume: 38mL. I believe this may well be artifactual as this is present at the extreme left parietal vertex. Mismatch Volume: 4ML, but suspicion of artifactual nature as described above. Infarction Location:None IMPRESSION: No large or medium vessel occlusion. No infarction demonstrated by perfusion. No convincing T-max prolongation. Probable artifactual finding at the left parietal vertex. Widespread atherosclerotic disease. Aortic atherosclerosis. Scattered plaque affecting most major arterial vessels. No flow limiting stenosis at either carotid bifurcation. 50% stenosis of the right vertebral artery origin. Severe stenosis of the left vertebral artery origin, nearly occluded. This non dominant small vessel does appear to show flow through its course however. There is a second severe stenosis at about the level of C4. 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 Upper cervical internal carotid arteries are tortuous and ectatic but do not show evidence of stenosis or dissection. Electronically Signed   By: Nelson Chimes M.D.   On: 2019-08-08 10:13   Ct Code Stroke Cta Neck W/wo Contrast  Result Date: 08-08-2019 CLINICAL DATA:  Unresponsive and leaning towards the right. Acute stroke presentation. EXAM: CT  ANGIOGRAPHY HEAD AND NECK CT PERFUSION BRAIN TECHNIQUE: Multidetector CT imaging of the head and neck was performed using the standard protocol during bolus administration of intravenous contrast. Multiplanar CT image reconstructions and MIPs were obtained to evaluate the vascular anatomy. Carotid stenosis measurements (when applicable) are obtained utilizing NASCET criteria, using the distal internal carotid diameter as the denominator. Multiphase CT imaging of the brain was performed following IV bolus contrast injection. Subsequent parametric perfusion maps were calculated using RAPID software. CONTRAST:  47mL OMNIPAQUE IOHEXOL 350 MG/ML SOLN COMPARISON:  Head CT same day FINDINGS: CTA NECK FINDINGS Aortic arch: Diffuse aortic atherosclerotic calcification. No aneurysm or dissection. Brachiocephalic vessel origins not included on the study. Right carotid system: Common carotid artery shows widespread atherosclerotic calcification but is widely patent to the bifurcation. There is calcified plaque at the carotid bifurcation and ICA bulb but no stenosis when compared to the more distal cervical ICA diameter. Upper cervical ICA is tortuous and  mildly ectatic. Left carotid system: Common carotid artery shows calcified plaque but no stenosis greater than 20%. Calcified plaque at the carotid bifurcation and ICA bulb. No stenosis when compared to the more distal cervical ICA. Also on this side, the distal cervical ICA is tortuous and ectatic. Vertebral arteries: Calcified plaque at the right vertebral artery origin with stenosis of 50%. Beyond that, the dominant right vertebral artery shows scattered plaque but no stenosis and is widely patent to the foramen magnum. Non dominant left vertebral artery does not show visible patency at the origin, but I suspect there may be a very tiny patent lumen. Beyond that, the vessel shows plaque and does show opacification to the foramen magnum. Extensive calcified plaque at the C4  level also results in an additional severe stenosis of this vessel. Skeleton: Chronic cervical spondylosis and facet arthropathy. Other neck: No mass or lymphadenopathy. Upper chest: Advanced emphysema.  No active process otherwise. Review of the MIP images confirms the above findings CTA HEAD FINDINGS Anterior circulation: Both internal carotid arteries are patent through the skull base and siphon regions. Atherosclerotic calcification in the carotid siphons but no stenosis. The anterior and middle cerebral vessels are patent without evidence of large or medium vessel occlusion. 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 Posterior circulation: Dominant right vertebral artery is widely patent through the foramen magnum to the basilar. Tiny left vertebral artery does show flow and apparently reaches the basilar. Both posteroinferior cerebellar arteries show flow. No basilar stenosis. Superior cerebellar and posterior cerebral arteries are patent. Venous sinuses: Patent and normal. Anatomic variants: None significant. Review of the MIP images confirms the above findings CT Brain Perfusion Findings: ASPECTS: 10 CBF (<30%) Volume: 53mL Perfusion (Tmax>6.0s) volume: 48mL. I believe this may well be artifactual as this is present at the extreme left parietal vertex. Mismatch Volume: 4ML, but suspicion of artifactual nature as described above. Infarction Location:None IMPRESSION: No large or medium vessel occlusion. No infarction demonstrated by perfusion. No convincing T-max prolongation. Probable artifactual finding at the left parietal vertex. Widespread atherosclerotic disease. Aortic atherosclerosis. Scattered plaque affecting most major arterial vessels. No flow limiting stenosis at either carotid bifurcation. 50% stenosis of the right vertebral artery origin. Severe stenosis of the left vertebral artery origin, nearly occluded. This non dominant small vessel does appear to show flow  through its course however. There is a second severe stenosis at about the level of C4. 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 Upper cervical internal carotid arteries are tortuous and ectatic but do not show evidence of stenosis or dissection. Electronically Signed   By: Nelson Chimes M.D.   On: 2019-07-17 10:13   Ct Code Stroke Cerebral Perfusion With Contrast  Result Date: 2019-07-17 CLINICAL DATA:  Unresponsive and leaning towards the right. Acute stroke presentation. EXAM: CT ANGIOGRAPHY HEAD AND NECK CT PERFUSION BRAIN TECHNIQUE: Multidetector CT imaging of the head and neck was performed using the standard protocol during bolus administration of intravenous contrast. Multiplanar CT image reconstructions and MIPs were obtained to evaluate the vascular anatomy. Carotid stenosis measurements (when applicable) are obtained utilizing NASCET criteria, using the distal internal carotid diameter as the denominator. Multiphase CT imaging of the brain was performed following IV bolus contrast injection. Subsequent parametric perfusion maps were calculated using RAPID software. CONTRAST:  35mL OMNIPAQUE IOHEXOL 350 MG/ML SOLN COMPARISON:  Head CT same day FINDINGS: CTA NECK FINDINGS Aortic arch: Diffuse aortic atherosclerotic calcification. No aneurysm or dissection. Brachiocephalic vessel  origins not included on the study. Right carotid system: Common carotid artery shows widespread atherosclerotic calcification but is widely patent to the bifurcation. There is calcified plaque at the carotid bifurcation and ICA bulb but no stenosis when compared to the more distal cervical ICA diameter. Upper cervical ICA is tortuous and mildly ectatic. Left carotid system: Common carotid artery shows calcified plaque but no stenosis greater than 20%. Calcified plaque at the carotid bifurcation and ICA bulb. No stenosis when compared to the more distal cervical ICA. Also on this side, the distal cervical ICA is tortuous and  ectatic. Vertebral arteries: Calcified plaque at the right vertebral artery origin with stenosis of 50%. Beyond that, the dominant right vertebral artery shows scattered plaque but no stenosis and is widely patent to the foramen magnum. Non dominant left vertebral artery does not show visible patency at the origin, but I suspect there may be a very tiny patent lumen. Beyond that, the vessel shows plaque and does show opacification to the foramen magnum. Extensive calcified plaque at the C4 level also results in an additional severe stenosis of this vessel. Skeleton: Chronic cervical spondylosis and facet arthropathy. Other neck: No mass or lymphadenopathy. Upper chest: Advanced emphysema.  No active process otherwise. Review of the MIP images confirms the above findings CTA HEAD FINDINGS Anterior circulation: Both internal carotid arteries are patent through the skull base and siphon regions. Atherosclerotic calcification in the carotid siphons but no stenosis. The anterior and middle cerebral vessels are patent without evidence of large or medium vessel occlusion. 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 Posterior circulation: Dominant right vertebral artery is widely patent through the foramen magnum to the basilar. Tiny left vertebral artery does show flow and apparently reaches the basilar. Both posteroinferior cerebellar arteries show flow. No basilar stenosis. Superior cerebellar and posterior cerebral arteries are patent. Venous sinuses: Patent and normal. Anatomic variants: None significant. Review of the MIP images confirms the above findings CT Brain Perfusion Findings: ASPECTS: 10 CBF (<30%) Volume: 37mL Perfusion (Tmax>6.0s) volume: 65mL. I believe this may well be artifactual as this is present at the extreme left parietal vertex. Mismatch Volume: 4ML, but suspicion of artifactual nature as described above. Infarction Location:None IMPRESSION: No large or medium vessel  occlusion. No infarction demonstrated by perfusion. No convincing T-max prolongation. Probable artifactual finding at the left parietal vertex. Widespread atherosclerotic disease. Aortic atherosclerosis. Scattered plaque affecting most major arterial vessels. No flow limiting stenosis at either carotid bifurcation. 50% stenosis of the right vertebral artery origin. Severe stenosis of the left vertebral artery origin, nearly occluded. This non dominant small vessel does appear to show flow through its course however. There is a second severe stenosis at about the level of C4. 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 Upper cervical internal carotid arteries are tortuous and ectatic but do not show evidence of stenosis or dissection. Electronically Signed   By: Nelson Chimes M.D.   On: 07/22/19 10:13   Dg Chest Portable 1 View  Result Date: 07/22/2019 CLINICAL DATA:  Failure to thrive. Diabetes, hypertension and COPD. EXAM: PORTABLE CHEST 1 VIEW COMPARISON:  08/13/2014 FINDINGS: Aortic atherosclerosis. Normal heart size. No pleural effusion or edema. Lungs appear hyperinflated but clear. Coarsened interstitial markings of emphysema noted. IMPRESSION: No acute cardiopulmonary abnormalities. Electronically Signed   By: Kerby Moors M.D.   On: 2019/07/22 10:49   Ct Head Code Stroke Wo Contrast  Result Date: 2019-07-22 CLINICAL DATA:  Code stroke.  Unresponsive and leaning towards the right side. EXAM: CT HEAD WITHOUT CONTRAST TECHNIQUE: Contiguous axial images were obtained from the base of the skull through the vertex without intravenous contrast. COMPARISON:  09/22/2013 FINDINGS: Brain: There chronic appearing small vessel changes of the pons. Few old small vessel cerebellar infarctions. Chronic small-vessel ischemic changes affect the cerebral hemispheric white matter. 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 Small right parietal vertex cortical and subcortical infarction, not visible on the  previous study but appearing old. No sign of acute infarction, mass lesion, hemorrhage, hydrocephalus or extra-axial collection. Vascular: There is atherosclerotic calcification of the major vessels at the base of the brain. Skull: Negative Sinuses/Orbits: Clear/normal Other: None ASPECTS (Superior Stroke Program Early CT Score) - Ganglionic level infarction (caudate, lentiform nuclei, internal capsule, insula, M1-M3 cortex): 7 - Supraganglionic infarction (M4-M6 cortex): 3 Total score (0-10 with 10 being normal): 10 IMPRESSION: 1. No acute finding by CT. Atrophy and chronic small-vessel ischemic changes. 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 Small old right parietal vertex cortical infarction, new since the study of 2015 but not acute. 2. ASPECTS is 10 3. These results were communicated to Dr. Rory Percy at 9:37 amon 2020-12-15by text page via the [facility] messaging system. Electronically Signed   By: Nelson Chimes M.D.   On: July 25, 2019 09:38    Procedures .Critical Care Performed by: Frederica Kuster, PA-C Authorized by: Frederica Kuster, PA-C   Critical care provider statement:    Critical care time (minutes):  60   Critical care was necessary to treat or prevent imminent or life-threatening deterioration of the following conditions:  Cardiac failure, dehydration, hepatic failure, renal failure and sepsis   Critical care was time spent personally by me on the following activities:  Discussions with consultants, evaluation of patient's response to treatment, examination of patient, ordering and performing treatments and interventions, ordering and review of laboratory studies, ordering and review of radiographic studies, pulse oximetry, re-evaluation of patient's condition, obtaining history from patient or surrogate and review of old charts   I assumed direction of critical care for this patient from another provider in my specialty: no     (including critical care time)  Medications Ordered  in ED Medications  sodium chloride flush (NS) 0.9 % injection 3 mL (has no administration in time range)  0.9 %  sodium chloride infusion (has no administration in time range)  vancomycin (VANCOCIN) IVPB 1000 mg/200 mL premix (has no administration in time range)  ceFEPIme (MAXIPIME) 1 g in sodium chloride 0.9 % 100 mL IVPB (has no administration in time range)  vancomycin variable dose per unstable renal function (pharmacist dosing) (has no administration in time range)  iohexol (OMNIPAQUE) 350 MG/ML injection 85 mL (85 mLs Intravenous Contrast Given 07/25/19 0952)  dextrose 50 % solution (25 mLs  Given 25-Jul-2019 1016)  sodium chloride 0.9 % bolus 500 mL (500 mLs Intravenous New Bag/Given 25-Jul-2019 1057)  sodium chloride 0.9 % bolus 1,000 mL (1,000 mLs Intravenous New Bag/Given Jul 25, 2019 1056)  ceFEPIme (MAXIPIME) 2 g in sodium chloride 0.9 % 100 mL IVPB (2 g Intravenous New Bag/Given 07-25-19 1115)  metroNIDAZOLE (FLAGYL) IVPB 500 mg (500 mg Intravenous New Bag/Given 25-Jul-2019 1113)     Initial Impression / Assessment and Plan / ED Course  I have reviewed the triage vital signs and the nursing notes.  Pertinent labs & imaging results that were available during my care of the patient were reviewed by me and considered in my medical decision making (see  chart for details).  Clinical Course as of Jul 14 1332  Sat Jul 15, 2019  0958 Patient arrives by EMS from home for possible stroke activation.  Unknown last known well.  Patient nonverbal not following any commands.  She was immediately brought to the CT suite where she had a CT head and a CTA that did not show any acute findings.  Patient has a DNR.  We will continue to reach out to family to see what they are expectations are.   [MB]  1025 EKG showing probable sinus bradycardia versus junctional escape with diffuse nonspecific ST-T changes.  Possible ischemia but consider electrolyte derangement   [MB]  1101 Code sepsis delayed initially due to  family decision on extensive measures. Will initiate with permission from daughter-in-Meghan Warshawsky at bedside. Unknown source antibiotics and fluids initiated.   [AL]    Clinical Course User Index [AL] Frederica Kuster, PA-C [MB] Hayden Rasmussen, MD       Patient presenting with unresponsiveness, initially code stroke was called.  Initial CT imaging is negative for acute stroke.  Neurology recommends MRI.  However, patient is hypothermic.  Placed on [facility].  Suspect more likely sepsis as cause.  Weight-based fluids and broad-spectrum antibiotics initiated after family gave permission of these measures.  Unsure of source.  Significant AKI with creatinine 2.87, elevated LFTs and anion gap of 20.  Troponin greater than 1200.  Lactate 3.9.  COVID-19 screening negative.  Arterial pH 7.25.  Suspect multisystem organ failure.  Family made aware of all these findings and understand the gravity of patient's illness.  I discussed patient case with Dr. Caryl Comes with cardiology who advises medicine can consult if needed.  I discussed patient case with internal medicine teaching service who accepts patient for admission.  I appreciate their assistance with the patient.  Patient also guided by my attending, Dr. Melina Copa, who guided the patient's management and agrees with plan.  [facility] and I discussed patient case with physician on-call, Dr. Jimmye Norman.  She left her phone number and is available for questions, if needed [phone]  Final Clinical Impressions(s) / ED Diagnoses   Final diagnoses:  Sepsis with acute renal failure without septic shock, due to unspecified organism, unspecified acute renal failure type [facility])    ED Discharge Orders    None       Frederica Kuster, PA-C Jul 28, 2019 1334    Frederica Kuster, PA-C July 28, 2019 1337    Hayden Rasmussen, MD 2019/07/28 1720

## 2019-08-11 NOTE — Death Summary Note (Signed)
  Name: Stacy Byrd MRN: [id number] DOB: June 09, 1931 86 y.o.  Date of Admission: 2019/08/11  9:21 AM Date of Discharge: 08/11/19 Attending Physician: Bartholomew Crews, MD  Discharge Diagnosis: Active Problems:   NSTEMI (non-ST elevated myocardial infarction) [facility])   Cause of death: NSTEMI Time of death: 08-11-19 17:02  Disposition and follow-up:   [name] was discharged from [facility] in expired condition.    Hospital Course: [name] is an 86 yo F w/ PMH of NSTEMI, CAD, COPD, DM, A-Fib, GERD who presented w/ altered mental status after found unresponsive by family. She was brought in as code stroke but her CT showed no acute findings. She was found to have elevated troponin of 1242 with hypotension and bradycardia. Discussed case with family who mentioned that patient expressed wish to pass away peacefully. Family also agreed with the decision to proceed to comfort care and [name] passed on morphine gtt.  Signed: Mosetta Anis, MD 2019/08/11, 8:14 PM  Pager: [phone]

## 2019-08-11 DEATH — deceased

## 2021-09-20 IMAGING — CT CT HEAD CODE STROKE
4 series · 16 of 47 positions shown, 18 images · non-contrast
Comparison: 09/22/2013

CLINICAL DATA: Code stroke. Unresponsive and leaning towards the
right side.

EXAM:
CT HEAD WITHOUT CONTRAST
TECHNIQUE: Contiguous axial images were obtained from the base of the skull
through the vertex without intravenous contrast.

[Series 3: head wo · axial · 0.45mm/px · z∈[+970,+1095]mm · 7 of 35 slices shown, 9 images]
[im 5/35  brain]
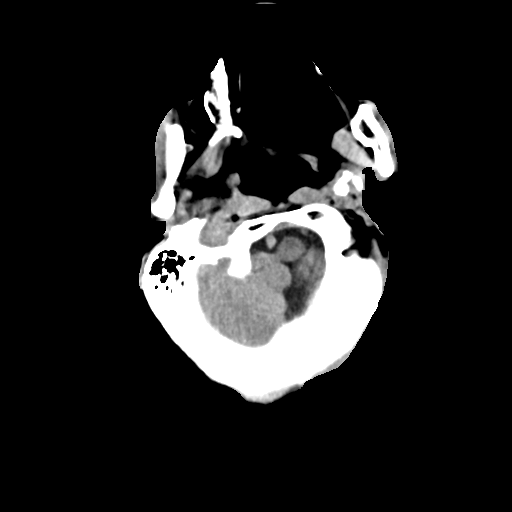
[im 5/35  bone]
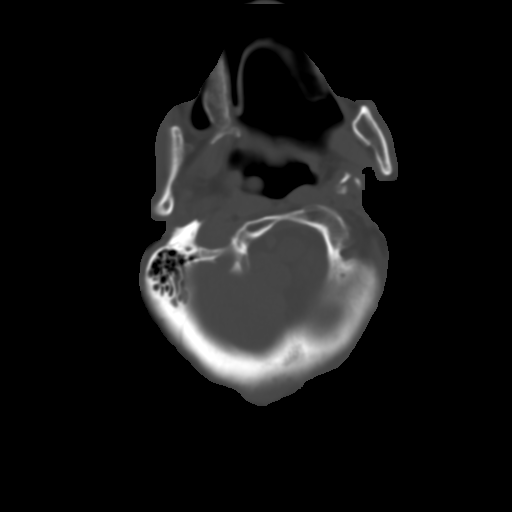
[im 9/35  brain]
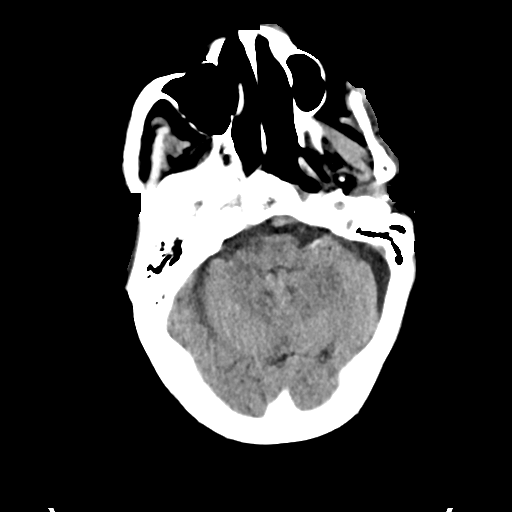
[im 13/35  brain]
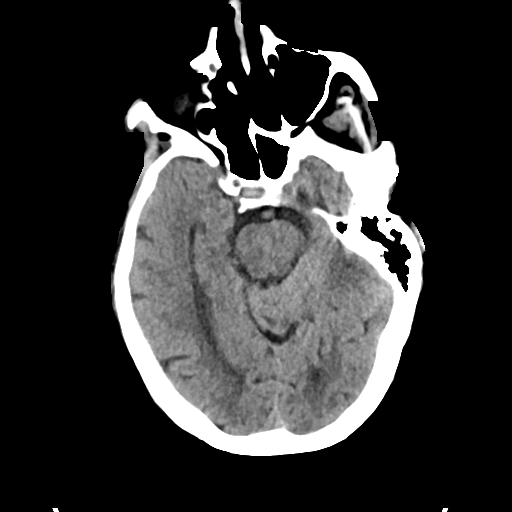
[im 18/35  brain]
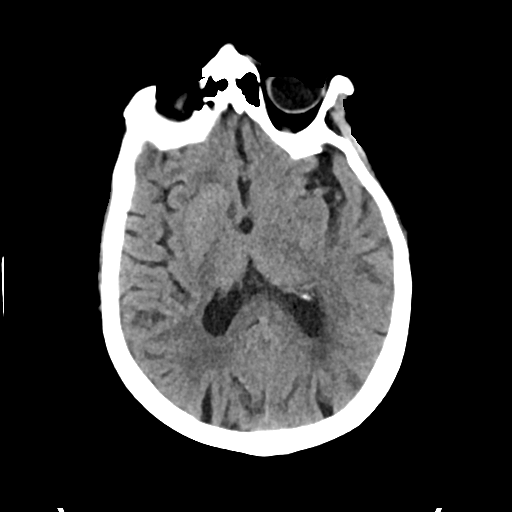
[im 22/35  brain]
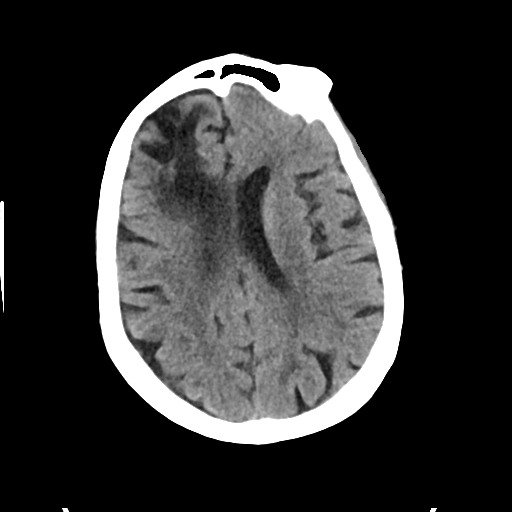
[im 22/35  bone]
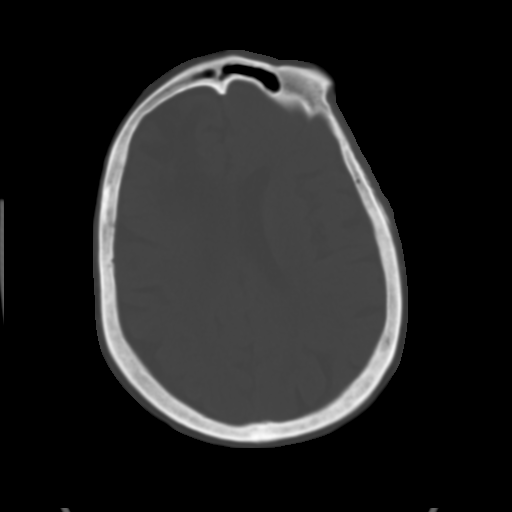
[im 26/35  brain]
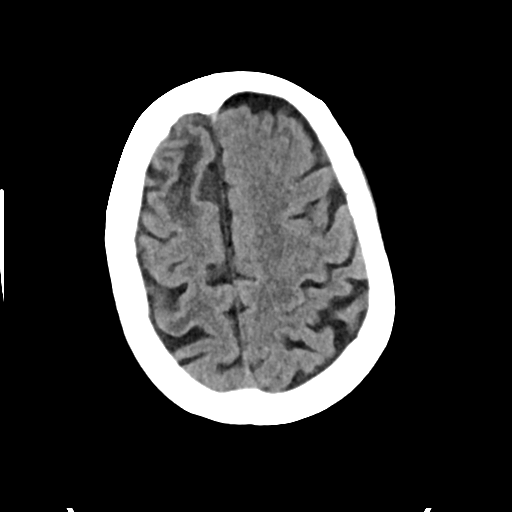
[im 30/35  brain]
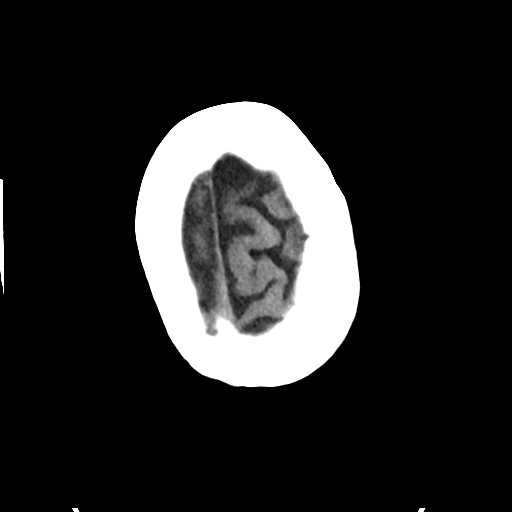

[Series 4: head bone · axial · 0.45mm/px · z∈[+966,+1002]mm · 3 of 88 slices shown]
[im 9/88  bone]
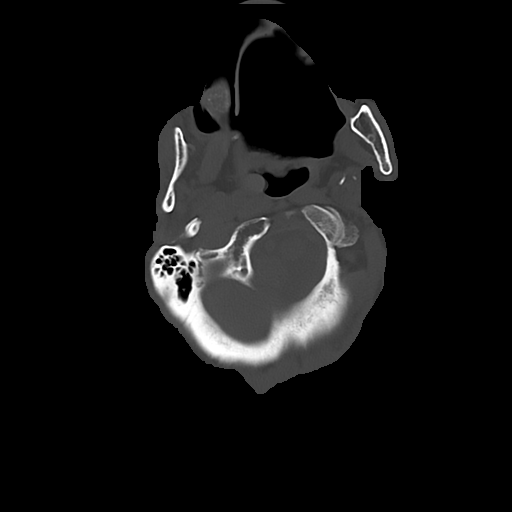
[im 18/88  bone]
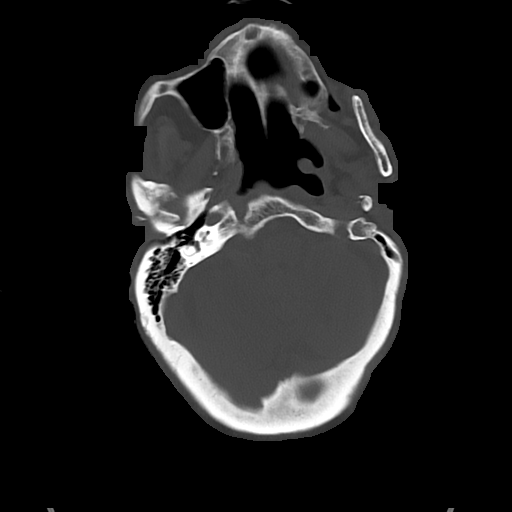
[im 27/88  bone]
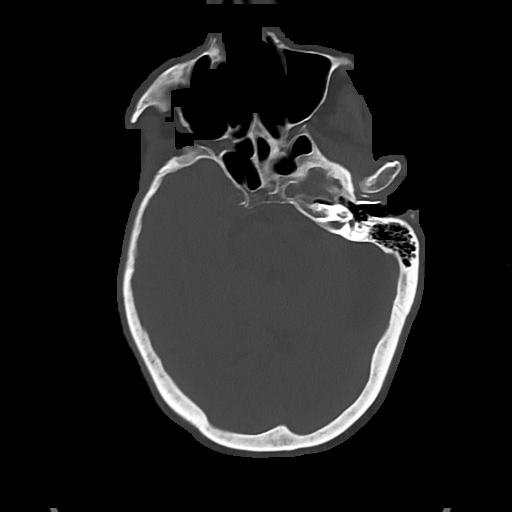

[Series 5: cor soft · coronal · 0.32mm/px · 3 of 68 slices shown]
[im 23/68  brain]
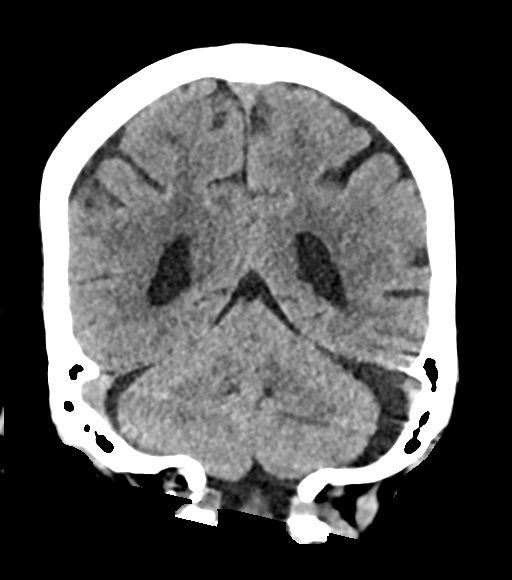
[im 30/68  brain]
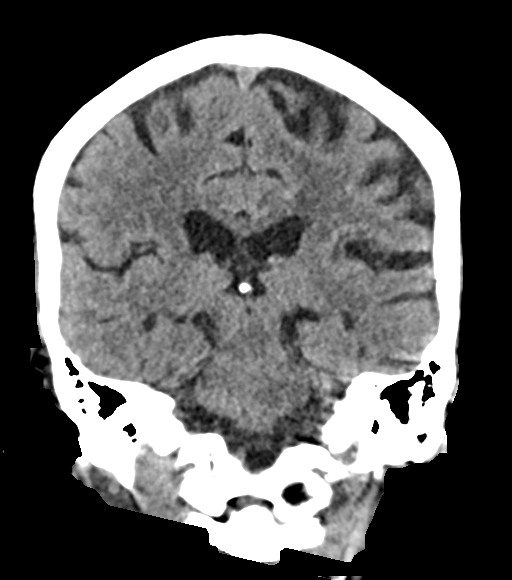
[im 38/68  brain]
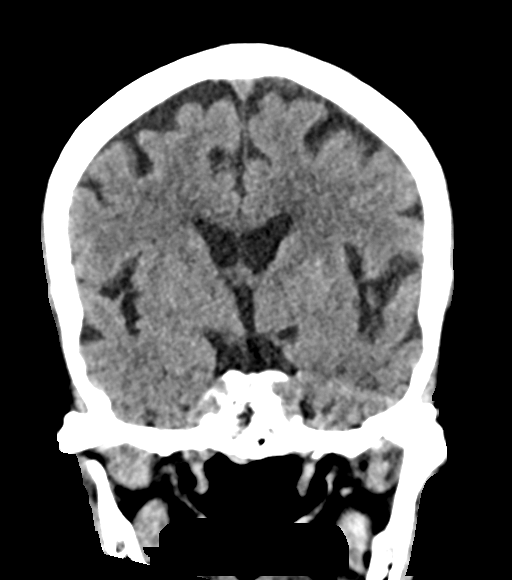

[Series 6: sag soft · sagittal · 0.37mm/px · 3 of 51 slices shown]
[im 21/51  brain]
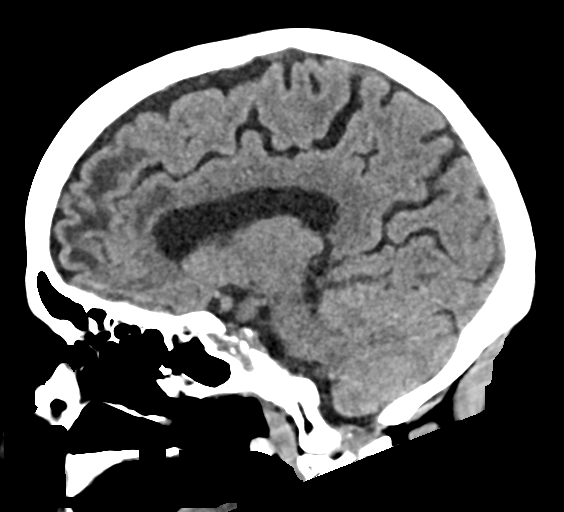
[im 26/51  brain]
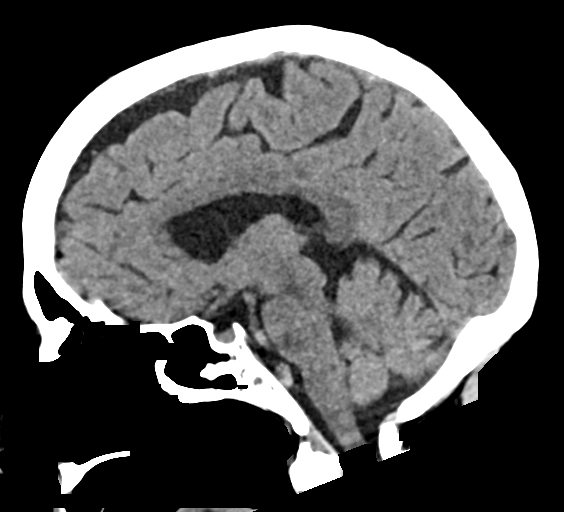
[im 31/51  brain]
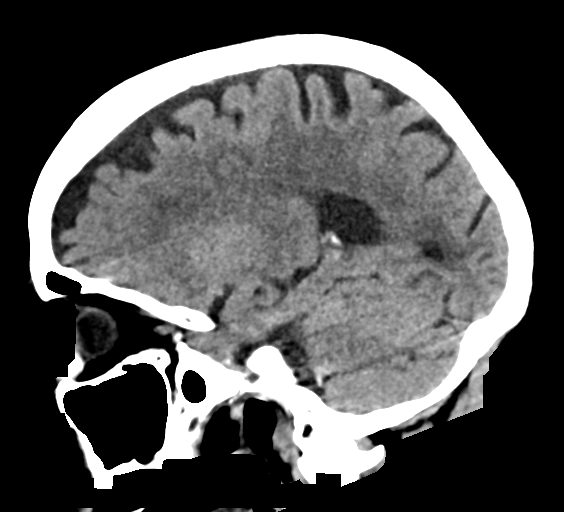

[16 of 47 positions shown; findings below may reference images not displayed]

FINDINGS: Brain: There chronic appearing small vessel changes of the pons. Few
old small vessel cerebellar infarctions. Chronic small-vessel
ischemic changes affect the cerebral hemispheric white matter. Old
right frontal cortical and subcortical infarction. Small right
parietal vertex cortical and subcortical infarction, not visible on
the previous study but appearing old. No sign of acute infarction,
mass lesion, hemorrhage, hydrocephalus or extra-axial collection.

Vascular: There is atherosclerotic calcification of the major
vessels at the base of the brain.

Skull: Negative

Sinuses/Orbits: Clear/normal

Other: None

ASPECTS ([place] Stroke Program Early CT Score)

- Ganglionic level infarction (caudate, lentiform nuclei, internal
capsule, insula, M1-M3 cortex): 7

- Supraganglionic infarction (M4-M6 cortex): 3

Total score (0-10 with 10 being normal): 10
IMPRESSION: 1. No acute finding by CT. Atrophy and chronic small-vessel ischemic
changes. Old right frontal cortical and subcortical infarction.
Small old right parietal vertex cortical infarction, new since the
study of 5962 but not acute.
2. ASPECTS is 10
3. These results were communicated to Dr. Juvenildo at [DATE] [place]
07/15/2019by text page via the [employer] messaging system.
